# Patient Record
Sex: Female | Born: 1935 | Race: Black or African American | Hispanic: No | Smoking: Never smoker
Health system: Southern US, Community
[De-identification: ages and names within clinical notes are randomized; demographics above are authoritative.]

## PROBLEM LIST (undated history)

## (undated) DIAGNOSIS — I10 Essential (primary) hypertension: Secondary | ICD-10-CM

## (undated) HISTORY — PX: BACK SURGERY: SHX140

---

## 1988-10-20 HISTORY — PX: CERVICAL SPINE SURGERY: SHX589

## 2018-03-20 ENCOUNTER — Emergency Department (HOSPITAL_COMMUNITY): Payer: Medicare Other

## 2018-03-20 ENCOUNTER — Encounter (HOSPITAL_COMMUNITY): Payer: Self-pay | Admitting: Emergency Medicine

## 2018-03-20 ENCOUNTER — Inpatient Hospital Stay (HOSPITAL_COMMUNITY)
Admission: EM | Admit: 2018-03-20 | Discharge: 2018-03-25 | DRG: 481 | Disposition: A | Discharge status: Skilled Nursing Facility | Payer: Medicare Other | Attending: Family Medicine | Admitting: Family Medicine

## 2018-03-20 DIAGNOSIS — I69351 Hemiplegia and hemiparesis following cerebral infarction affecting right dominant side: Secondary | ICD-10-CM | POA: Diagnosis not present

## 2018-03-20 DIAGNOSIS — D62 Acute posthemorrhagic anemia: Secondary | ICD-10-CM | POA: Diagnosis not present

## 2018-03-20 DIAGNOSIS — E785 Hyperlipidemia, unspecified: Secondary | ICD-10-CM | POA: Diagnosis present

## 2018-03-20 DIAGNOSIS — E1142 Type 2 diabetes mellitus with diabetic polyneuropathy: Secondary | ICD-10-CM | POA: Diagnosis present

## 2018-03-20 DIAGNOSIS — Z7984 Long term (current) use of oral hypoglycemic drugs: Secondary | ICD-10-CM

## 2018-03-20 DIAGNOSIS — Z7902 Long term (current) use of antithrombotics/antiplatelets: Secondary | ICD-10-CM | POA: Diagnosis not present

## 2018-03-20 DIAGNOSIS — M549 Dorsalgia, unspecified: Secondary | ICD-10-CM | POA: Diagnosis present

## 2018-03-20 DIAGNOSIS — W010XXA Fall on same level from slipping, tripping and stumbling without subsequent striking against object, initial encounter: Secondary | ICD-10-CM | POA: Diagnosis present

## 2018-03-20 DIAGNOSIS — R52 Pain, unspecified: Secondary | ICD-10-CM

## 2018-03-20 DIAGNOSIS — Z01818 Encounter for other preprocedural examination: Secondary | ICD-10-CM

## 2018-03-20 DIAGNOSIS — I10 Essential (primary) hypertension: Secondary | ICD-10-CM | POA: Diagnosis present

## 2018-03-20 DIAGNOSIS — D649 Anemia, unspecified: Secondary | ICD-10-CM

## 2018-03-20 DIAGNOSIS — Y9289 Other specified places as the place of occurrence of the external cause: Secondary | ICD-10-CM | POA: Diagnosis not present

## 2018-03-20 DIAGNOSIS — G8929 Other chronic pain: Secondary | ICD-10-CM | POA: Diagnosis present

## 2018-03-20 DIAGNOSIS — Z8042 Family history of malignant neoplasm of prostate: Secondary | ICD-10-CM | POA: Diagnosis not present

## 2018-03-20 DIAGNOSIS — Z09 Encounter for follow-up examination after completed treatment for conditions other than malignant neoplasm: Secondary | ICD-10-CM | POA: Diagnosis not present

## 2018-03-20 DIAGNOSIS — S72002A Fracture of unspecified part of neck of left femur, initial encounter for closed fracture: Secondary | ICD-10-CM | POA: Diagnosis present

## 2018-03-20 DIAGNOSIS — Z7983 Long term (current) use of bisphosphonates: Secondary | ICD-10-CM

## 2018-03-20 DIAGNOSIS — Z880 Allergy status to penicillin: Secondary | ICD-10-CM | POA: Diagnosis not present

## 2018-03-20 DIAGNOSIS — Z833 Family history of diabetes mellitus: Secondary | ICD-10-CM

## 2018-03-20 DIAGNOSIS — Z66 Do not resuscitate: Secondary | ICD-10-CM | POA: Diagnosis present

## 2018-03-20 DIAGNOSIS — Z823 Family history of stroke: Secondary | ICD-10-CM

## 2018-03-20 DIAGNOSIS — S72009A Fracture of unspecified part of neck of unspecified femur, initial encounter for closed fracture: Secondary | ICD-10-CM | POA: Diagnosis present

## 2018-03-20 DIAGNOSIS — S72145A Nondisplaced intertrochanteric fracture of left femur, initial encounter for closed fracture: Principal | ICD-10-CM

## 2018-03-20 DIAGNOSIS — G8191 Hemiplegia, unspecified affecting right dominant side: Secondary | ICD-10-CM

## 2018-03-20 DIAGNOSIS — I6930 Unspecified sequelae of cerebral infarction: Secondary | ICD-10-CM | POA: Diagnosis not present

## 2018-03-20 DIAGNOSIS — S81802D Unspecified open wound, left lower leg, subsequent encounter: Secondary | ICD-10-CM | POA: Diagnosis not present

## 2018-03-20 DIAGNOSIS — M8580 Other specified disorders of bone density and structure, unspecified site: Secondary | ICD-10-CM | POA: Diagnosis present

## 2018-03-20 DIAGNOSIS — S81802A Unspecified open wound, left lower leg, initial encounter: Secondary | ICD-10-CM

## 2018-03-20 HISTORY — DX: Essential (primary) hypertension: I10

## 2018-03-20 LAB — BASIC METABOLIC PANEL
ANION GAP: 9 (ref 5–15)
BUN: 41 mg/dL — AB (ref 6–20)
CO2: 27 mmol/L (ref 22–32)
Calcium: 9.2 mg/dL (ref 8.9–10.3)
Chloride: 109 mmol/L (ref 101–111)
Creatinine, Ser: 1.11 mg/dL — ABNORMAL HIGH (ref 0.44–1.00)
GFR calc Af Amer: 51 mL/min — ABNORMAL LOW (ref >60)
GFR, EST NON AFRICAN AMERICAN: 44 mL/min — AB (ref >60)
GLUCOSE: 141 mg/dL — AB (ref 65–99)
POTASSIUM: 3.8 mmol/L (ref 3.5–5.1)
Sodium: 145 mmol/L (ref 135–145)

## 2018-03-20 LAB — CBC
HEMATOCRIT: 37.6 % (ref 36.0–46.0)
HEMOGLOBIN: 11.7 g/dL — AB (ref 12.0–15.0)
MCH: 26.8 pg (ref 26.0–34.0)
MCHC: 31.1 g/dL (ref 30.0–36.0)
MCV: 86.2 fL (ref 78.0–100.0)
Platelets: 191 10*3/uL (ref 150–400)
RBC: 4.36 MIL/uL (ref 3.87–5.11)
RDW: 14.5 % (ref 11.5–15.5)
WBC: 9 10*3/uL (ref 4.0–10.5)

## 2018-03-20 MED ORDER — MORPHINE SULFATE (PF) 4 MG/ML IV SOLN
4.0000 mg | Freq: Once | INTRAVENOUS | Status: AC
  Administered 2018-03-20: 4 mg via INTRAVENOUS
  Filled 2018-03-20: qty 1

## 2018-03-20 MED ORDER — MORPHINE SULFATE (PF) 4 MG/ML IV SOLN
4.0000 mg | Freq: Once | INTRAVENOUS | Status: DC
  Filled 2018-03-20: qty 1

## 2018-03-20 MED ORDER — MORPHINE SULFATE (PF) 2 MG/ML IV SOLN
1.0000 mg | INTRAVENOUS | Status: DC | PRN
  Administered 2018-03-20: 1 mg via INTRAVENOUS

## 2018-03-20 NOTE — Progress Notes (Signed)
Patient ID: Linda PalmsDarsena Hartman, female   DOB: 06/24/1931, 84 y.o.   MRN: [id number] I have seen the x-rays on [name].  She has a non-displaced left hip intertrochanteric fracture.  Will plan for surgery tomorrow (6/2).  Will need to be NPO after midnight tonight.  This type of fracture pattern will do well with a rod/hip screw and should allow the patient to be able to fully bear weight post-op.

## 2018-03-20 NOTE — ED Notes (Signed)
Bed: [id number] Expected date:  Expected time:  Means of arrival:  Comments: 86 f fall

## 2018-03-20 NOTE — ED Triage Notes (Signed)
[place], had a fall about a hour ago, uses a walker and caught in a revolving door. C/o left hip pain. Pain score zero / 20 in Lhand/ 50 mcg fentyle in. No pain now. V/s on bp 136/56, pluse 72, rr20 spo2 94, cbg 177. Hx of diabetes. Hx of HTN.Alert x 4, no loc no blood thinners,

## 2018-03-20 NOTE — ED Provider Notes (Addendum)
[facility]-EMERGENCY DEPT Provider Note   CSN: [id number] Arrival date & time: 03/20/18  1938     History   Chief Complaint Chief Complaint  Patient presents with  . Hip Pain    left     HPI [name] is a 84 y.o. female.  Patient attending convention, states got knocked off balance/to ground by revolving door. C/o left hip pain. Has not tried to ambulate since. No prior hip injury. Pain constant, dull, moderate, worse w movement, non radiating. Denies numbness/weakness. Denies any other pain or injury. No headache. No neck or back pain. No chest pain or sob. No anticoag use.   The history is provided by the patient.  Hip Pain  Pertinent negatives include no chest pain, no abdominal pain, no headaches and no shortness of breath.    History reviewed. No pertinent past medical history.  There are no active problems to display for this patient.   History reviewed. No pertinent surgical history.   OB History   None      Home Medications    Prior to Admission medications   Not on File    Family History No family history on file.  Social History Social History   Tobacco Use  . Smoking status: Not on file  Substance Use Topics  . Alcohol use: Not on file  . Drug use: Not on file     Allergies   Patient has no allergy information on record.   Review of Systems Review of Systems  Constitutional: Negative for fever.  HENT: Negative for sore throat.   Eyes: Negative for redness.  Respiratory: Negative for shortness of breath.   Cardiovascular: Negative for chest pain.  Gastrointestinal: Negative for abdominal pain and vomiting.  Genitourinary: Negative for flank pain.  Musculoskeletal: Negative for back pain and neck pain.  Skin: Negative for wound.  Neurological: Negative for headaches.  Hematological: Does not bruise/bleed easily.  Psychiatric/Behavioral: Negative for confusion.     Physical Exam Updated Vital  Signs BP (!) 165/60 (BP Location: Right Arm)   Pulse 60   Temp 98.1 F (36.7 C) (Oral)   Resp 20   SpO2 99%   Physical Exam  Constitutional: She appears well-developed and well-nourished.  HENT:  Head: Atraumatic.  Eyes: Pupils are equal, round, and reactive to light. Conjunctivae are normal. No scleral icterus.  Neck: Neck supple. No tracheal deviation present.  Cardiovascular: Normal rate, regular rhythm, normal heart sounds and intact distal pulses.  Pulmonary/Chest: Effort normal and breath sounds normal. No respiratory distress. She exhibits no tenderness.  Abdominal: Soft. Normal appearance and bowel sounds are normal. She exhibits no distension. There is no tenderness.  Musculoskeletal: She exhibits no edema.  CTLS spine, non tender, aligned, no step off. Tenderness left hip. No gross deformity, shortening or rotation of leg. Distal pulses palp bil.   Neurological: She is alert.  Speech normal. Motor/sens grossly intact bil.   Skin: Skin is warm and dry. No rash noted.  Psychiatric: She has a normal mood and affect.  Nursing note and vitals reviewed.    ED Treatments / Results  Labs (all labs ordered are listed, but only abnormal results are displayed) No results found for this or any previous visit. Dg Chest 1 View  Result Date: 03/20/2018 CLINICAL DATA:  Preoperative chest radiograph prior to LEFT hip surgery. EXAM: CHEST  1 VIEW COMPARISON:  None. FINDINGS: The patient is slightly rotated. UPPER limits normal heart size noted. There is no  evidence of focal airspace disease, pulmonary edema, suspicious pulmonary nodule/mass, pleural effusion, or pneumothorax. No acute bony abnormalities are identified. IMPRESSION: No active disease. Electronically Signed   By: Harmon Pier M.D.   On: 03/20/2018 22:41   Dg Hip Unilat W Or W/o Pelvis 2-3 Views Left  Result Date: 03/20/2018 CLINICAL DATA:  Recent fall left hip pain, initial encounter EXAM: DG HIP (WITH OR WITHOUT PELVIS) 2-3V  LEFT COMPARISON:  None. FINDINGS: There is a proximal left femoral fracture involving the intratrochanteric region without significant displacement. Pelvic ring is intact. No other focal abnormality is seen. IMPRESSION: Undisplaced intratrochanteric left femoral fracture. Electronically Signed   By: Alcide Clever M.D.   On: 03/20/2018 21:44    EKG EKG Interpretation  Date/Time:  Saturday March 20 2018 22:54:43 EDT Ventricular Rate:  58 PR Interval:    QRS Duration: 79 QT Interval:  435 QTC Calculation: 428 R Axis:   10 Text Interpretation:  Sinus rhythm Ventricular premature complex Nonspecific T wave abnormality Confirmed by Cathren Laine ([id number]) on 03/20/2018 10:57:35 PM   Radiology Dg Hip Unilat W Or W/o Pelvis 2-3 Views Left  Result Date: 03/20/2018 CLINICAL DATA:  Recent fall left hip pain, initial encounter EXAM: DG HIP (WITH OR WITHOUT PELVIS) 2-3V LEFT COMPARISON:  None. FINDINGS: There is a proximal left femoral fracture involving the intratrochanteric region without significant displacement. Pelvic ring is intact. No other focal abnormality is seen. IMPRESSION: Undisplaced intratrochanteric left femoral fracture. Electronically Signed   By: Alcide Clever M.D.   On: 03/20/2018 21:44    Procedures Procedures (including critical care time)  Medications Ordered in ED Medications  morphine 4 MG/ML injection 4 mg (0 mg Intravenous Hold 03/20/18 2217)  morphine 4 MG/ML injection 4 mg (4 mg Intravenous Given 03/20/18 2216)     Initial Impression / Assessment and Plan / ED Course  I have reviewed the triage vital signs and the nursing notes.  Pertinent labs & imaging results that were available during my care of the patient were reviewed by me and considered in my medical decision making (see chart for details).  Xrays.  Imaging study reviewed - left intertroch fx, non displaced.   Orthopedics on call consulted - discussed with Dr Magnus Ivan - he will see in AM, states tentatively will  plan for surgery tomorrow - states estimate for noon tomorrow.   Morphine 4 mg iv for pain.  Pain improved.   Hospitalists consulted for admission - discussed w Dr Adela Glimpse - will admit.   Final Clinical Impressions(s) / ED Diagnoses   Final diagnoses:  Fall from slip, trip, or stumble, initial encounter  Closed nondisplaced intertrochanteric fracture of left femur, initial encounter [facility])    ED Discharge Orders    None         Cathren Laine, MD 03/20/18 2258

## 2018-03-20 NOTE — H&P (Signed)
[name]:[id number] DOB: October 31, 1930 DOA: 03/20/2018     VHQ:[id number] Linda Skeens, MD  [facility]   Patient arrived to ER on 03/20/18 at 1938  Patient coming from:   home      Chief Complaint:  Chief Complaint  Patient presents with  . Hip Pain    left     HPI: [name] is a 84 y.o. female with medical history significant of DM2, polyneuropathy,  HTN, HLD, chronic back pain, hx of CVA with residual right side weakness.     Presented with  Fall after got walker stuck in revolving door.  Head injury denies loss of consciousness no other injury.  No chest pain shortness of breath patient is on Plavix for history of CVa no hx of stents.  Patient is not local she leaves in [place] Able to walk a flight of stairs without shortness of breath. No Chest pain, no shortness of breath.  Reports no hx of CAD,  Had CVA 2011 with residual  right side weakness   While in ER: Found to have non-displaced left hip intertrochanteric fracture   Following Medications were ordered in ER: Medications  morphine 4 MG/ML injection 4 mg (0 mg Intravenous Hold 03/20/18 2217)  morphine 4 MG/ML injection 4 mg (4 mg Intravenous Given 03/20/18 2216)    Significant initial  Findings: Abnormal Labs Reviewed  CBC - Abnormal; Notable for the following components:      Result Value   Hemoglobin 11.7 (*)    All other components within normal limits  BASIC METABOLIC PANEL - Abnormal; Notable for the following components:   Glucose, Bld 141 (*)    BUN 41 (*)    Creatinine, Ser 1.11 (*)    GFR calc non Af Amer 44 (*)    GFR calc Af Amer 51 (*)    All other components within normal limits     Na 145 K 3.8  Cr   Lab Results  Component Value Date   CREATININE 1.11 (H) 03/20/2018      WBC  9.0  HG/HCT      Component Value Date/Time   HGB 11.7 (L) 03/20/2018 2256   HCT 37.6 03/20/2018 2256      UA  not ordered     CXR - ordered  Hip left  -Undisplaced intratrochanteric left femoral fracture.  ECG:  Personally reviewed by me showing: HR : 58 Rhythm:  NSR,    no evidence of ischemic changes QTC 428     ED Triage Vitals  Enc Vitals Group     BP 03/20/18 1955 (!) 165/60     Pulse Rate 03/20/18 1955 60     Resp 03/20/18 1955 20     Temp 03/20/18 1955 98.1 F (36.7 C)     Temp Source 03/20/18 1955 Oral     SpO2 03/20/18 1955 99 %     Weight --      Height --      Head Circumference --      Peak Flow --      Pain Score 03/20/18 1957 0     Pain Loc --      Pain Edu? --      Excl. in GC? --   TMAX(24)@       Latest  Blood pressure (!) 165/60, pulse 60, temperature 98.1 F (36.7 C), temperature source Oral, resp. rate 20, SpO2 99 %.    ER Provider Called:   Orthopedics  Dr  Magnus Ivan They Recommend NPO post midnight,  Will see in AM  Hospitalist was called for admission for left hip fracture   Review of Systems:    Pertinent positives include: fall  Constitutional:  No weight loss, night sweats, Fevers, chills, fatigue, weight loss  HEENT:  No headaches, Difficulty swallowing,Tooth/dental problems,Sore throat,  No sneezing, itching, ear ache, nasal congestion, post nasal drip,  Cardio-vascular:  No chest pain, Orthopnea, PND, anasarca, dizziness, palpitations.no Bilateral lower extremity swelling  GI:  No heartburn, indigestion, abdominal pain, nausea, vomiting, diarrhea, change in bowel habits, loss of appetite, melena, blood in stool, hematemesis Resp:  no shortness of breath at rest. No dyspnea on exertion, No excess mucus, no productive cough, No non-productive cough, No coughing up of blood.No change in color of mucus.No wheezing. Skin:  no rash or lesions. No jaundice GU:  no dysuria, change in color of urine, no urgency or frequency. No straining to urinate.  No flank pain.  Musculoskeletal:  No joint pain or no joint swelling. No decreased range of motion. No back pain.  Psych:  No change  in mood or affect. No depression or anxiety. No memory loss.  Neuro: no localizing neurological complaints, no tingling, no weakness, no double vision, no gait abnormality, no slurred speech, no confusion  As per HPI otherwise 10 point review of systems negative.   Past Medical History:   Past Medical History:  Diagnosis Date  . Hypertension       Past Surgical History:  Procedure Laterality Date  . BACK SURGERY    . CERVICAL SPINE SURGERY  1990    Social History:  Ambulatory walker       reports that she has never smoked. She has never used smokeless tobacco. She reports that she does not drink alcohol. Her drug history is not on file.     Family History:   Family History  Problem Relation Age of Onset  . Diabetes Mother   . Stroke Mother   . Prostate cancer Brother     Allergies: Not on File   Prior to Admission medications   Not on File   Physical Exam: Blood pressure (!) 165/60, pulse 60, temperature 98.1 F (36.7 C), temperature source Oral, resp. rate 20, SpO2 99 %. 1. General:  in No Acute distress Well -appearing 2. Psychological: Alert and   Oriented 3. Head/ENT:     Dry Mucous Membranes                          Head Non traumatic, neck supple                           Poor Dentition 4. SKIN:   decreased Skin turgor,  Skin clean Dry and intact no rash 5. Heart: Regular rate and rhythm no  Murmur, no Rub or gallop 6. Lungs:  no wheezes or crackles   7. Abdomen: Soft, non-tender, Non distended  obese  bowel sounds present 8. Lower extremities: no clubbing, cyanosis, or edema 9. Neurologically Grossly intact, moving all 4 extremities equally  10. MSK: Normal range of motion except left hip due to pain    LABS:     Recent Labs  Lab 03/20/18 2256  WBC 9.0  HGB 11.7*  HCT 37.6  MCV 86.2  PLT 191   Basic Metabolic Panel: Recent Labs  Lab 03/20/18 2256  NA 145  K 3.8  CL 109  CO2 27  GLUCOSE 141*  BUN 41*  CREATININE 1.11*  CALCIUM  9.2      Radiological Exams on Admission: Dg Chest 1 View  Result Date: 03/20/2018 CLINICAL DATA:  Preoperative chest radiograph prior to LEFT hip surgery. EXAM: CHEST  1 VIEW COMPARISON:  None. FINDINGS: The patient is slightly rotated. UPPER limits normal heart size noted. There is no evidence of focal airspace disease, pulmonary edema, suspicious pulmonary nodule/mass, pleural effusion, or pneumothorax. No acute bony abnormalities are identified. IMPRESSION: No active disease. Electronically Signed   By: Harmon PierJeffrey  Hu M.D.   On: 03/20/2018 22:41   Dg Hip Unilat W Or W/o Pelvis 2-3 Views Left  Result Date: 03/20/2018 CLINICAL DATA:  Recent fall left hip pain, initial encounter EXAM: DG HIP (WITH OR WITHOUT PELVIS) 2-3V LEFT COMPARISON:  None. FINDINGS: There is a proximal left femoral fracture involving the intratrochanteric region without significant displacement. Pelvic ring is intact. No other focal abnormality is seen. IMPRESSION: Undisplaced intratrochanteric left femoral fracture. Electronically Signed   By: Alcide CleverMark  Lukens M.D.   On: 03/20/2018 21:44    Chart has been reviewed    Assessment/Plan   84 y.o. female with medical history significant of DM2, HTN    Admitted for left intertrochanteric hip fracture  Present on Admission:  . Closed left hip fracture (HCC) -  - management as per orthopedics,  plan to operate at noon.    Keep nothing by mouth post midnight. Patient  on   antiplatelet agents (plavix aspirin )on hold Ordered type and screen, Place Foley, order a vitamin D level  Patient at baseline  able to walk a flight of stairs or 100 feet      Patient denies any chest pain or shortness of breath currently and/or with exertion,    ECG showing no evidence of acute ischemia  no known history of coronary artery disease,  COPD  Liver failure   CKD  Given advanced age patient is at least moderate  risk   which has been discussed with family but at this point no furthther  cardiac workup is indicated.    . Essential hypertension stable resume home medications when able to take p.o.  History of CVA Plavix on hold   DM 2 -  - Order Sensitive   SSI     -  check TSH and HgA1C  - Hold by mouth medications     Other plan as per orders.  DVT prophylaxis:  SCD   Code Status:   DNR/DNI   as per patient   I had personally discussed CODE STATUS with patient and family   Family Communication:   Family   at  Bedside  plan of care was discussed with  Son,  Sister,    Disposition Plan:   likely will need placement for rehabilitation                                         Social Work    consulted                          Consults called: Dr. Magnus IvanBlackman of orthopedics  Admission status:    inpatient      Level of care    medical floor  Tatijana Bierly 03/21/2018, 12:18 AM    [facility]  Pager [phone]   after 2 AM please page floor coverage PA If 7AM-7PM, please contact the day team taking care of the patient  [email or web address]  Password [email or web address]

## 2018-03-21 ENCOUNTER — Other Ambulatory Visit: Payer: Self-pay

## 2018-03-21 ENCOUNTER — Inpatient Hospital Stay (HOSPITAL_COMMUNITY): Payer: Medicare Other | Admitting: Certified Registered Nurse Anesthetist

## 2018-03-21 ENCOUNTER — Inpatient Hospital Stay (HOSPITAL_COMMUNITY): Payer: Medicare Other

## 2018-03-21 ENCOUNTER — Encounter (HOSPITAL_COMMUNITY): Payer: Self-pay

## 2018-03-21 ENCOUNTER — Encounter (HOSPITAL_COMMUNITY)
Admission: EM | Disposition: A | Discharge status: Skilled Nursing Facility | Payer: Self-pay | Source: Home / Self Care | Attending: Internal Medicine

## 2018-03-21 DIAGNOSIS — G8191 Hemiplegia, unspecified affecting right dominant side: Secondary | ICD-10-CM

## 2018-03-21 DIAGNOSIS — S72145A Nondisplaced intertrochanteric fracture of left femur, initial encounter for closed fracture: Secondary | ICD-10-CM

## 2018-03-21 HISTORY — PX: FEMUR IM NAIL: SHX1597

## 2018-03-21 LAB — COMPREHENSIVE METABOLIC PANEL
ALBUMIN: 3.5 g/dL (ref 3.5–5.0)
ALT: 18 U/L (ref 14–54)
AST: 17 U/L (ref 15–41)
Alkaline Phosphatase: 55 U/L (ref 38–126)
Anion gap: 11 (ref 5–15)
BUN: 37 mg/dL — AB (ref 6–20)
CALCIUM: 8.9 mg/dL (ref 8.9–10.3)
CHLORIDE: 107 mmol/L (ref 101–111)
CO2: 26 mmol/L (ref 22–32)
Creatinine, Ser: 0.88 mg/dL (ref 0.44–1.00)
GFR calc Af Amer: >60 mL/min (ref >60)
GFR, EST NON AFRICAN AMERICAN: 59 mL/min — AB (ref >60)
GLUCOSE: 187 mg/dL — AB (ref 65–99)
Potassium: 3.5 mmol/L (ref 3.5–5.1)
Sodium: 144 mmol/L (ref 135–145)
TOTAL PROTEIN: 6.1 g/dL — AB (ref 6.5–8.1)
Total Bilirubin: 0.7 mg/dL (ref 0.3–1.2)

## 2018-03-21 LAB — GLUCOSE, CAPILLARY
GLUCOSE-CAPILLARY: 171 mg/dL — AB (ref 65–99)
Glucose-Capillary: 118 mg/dL — ABNORMAL HIGH (ref 65–99)
Glucose-Capillary: 308 mg/dL — ABNORMAL HIGH (ref 65–99)
Glucose-Capillary: 288 mg/dL — ABNORMAL HIGH (ref 65–99)

## 2018-03-21 LAB — CBC
HCT: 36.3 % (ref 36.0–46.0)
HEMOGLOBIN: 11.4 g/dL — AB (ref 12.0–15.0)
MCH: 26.8 pg (ref 26.0–34.0)
MCHC: 31.4 g/dL (ref 30.0–36.0)
MCV: 85.2 fL (ref 78.0–100.0)
Platelets: 168 10*3/uL (ref 150–400)
RBC: 4.26 MIL/uL (ref 3.87–5.11)
RDW: 14.5 % (ref 11.5–15.5)
WBC: 8.2 10*3/uL (ref 4.0–10.5)

## 2018-03-21 LAB — TYPE AND SCREEN
ABO/RH(D): B POS
Antibody Screen: NEGATIVE

## 2018-03-21 LAB — PROTIME-INR
INR: 1
Prothrombin Time: 13.1 seconds (ref 11.4–15.2)

## 2018-03-21 LAB — ABO/RH: ABO/RH(D): B POS

## 2018-03-21 LAB — HEMOGLOBIN A1C
HEMOGLOBIN A1C: 7 % — AB (ref 4.8–5.6)
Mean Plasma Glucose: 154.2 mg/dL

## 2018-03-21 SURGERY — INSERTION, INTRAMEDULLARY ROD, FEMUR
Anesthesia: General | Site: Hip | Laterality: Left

## 2018-03-21 MED ORDER — SUGAMMADEX SODIUM 200 MG/2ML IV SOLN
INTRAVENOUS | Status: DC | PRN
  Administered 2018-03-21: 200 mg via INTRAVENOUS

## 2018-03-21 MED ORDER — HYDROCODONE-ACETAMINOPHEN 5-325 MG PO TABS
1.0000 | ORAL_TABLET | Freq: Four times a day (QID) | ORAL | Status: DC | PRN
  Filled 2018-03-21 (×4): qty 2

## 2018-03-21 MED ORDER — GABAPENTIN 300 MG PO CAPS
300.0000 mg | ORAL_CAPSULE | Freq: Three times a day (TID) | ORAL | Status: DC
  Administered 2018-03-21 – 2018-03-25 (×11): 300 mg via ORAL
  Filled 2018-03-21 (×11): qty 1

## 2018-03-21 MED ORDER — SODIUM CHLORIDE 0.9 % IV SOLN
INTRAVENOUS | Status: AC
  Administered 2018-03-21: 01:00:00 via INTRAVENOUS

## 2018-03-21 MED ORDER — DOCUSATE SODIUM 100 MG PO CAPS: 100.0000 mg | ORAL_CAPSULE | Freq: Two times a day (BID) | ORAL | Status: DC

## 2018-03-21 MED ORDER — OXYCODONE HCL 5 MG/5ML PO SOLN
5.0000 mg | Freq: Once | ORAL | Status: DC | PRN
  Filled 2018-03-21: qty 5

## 2018-03-21 MED ORDER — ONDANSETRON HCL 4 MG/2ML IJ SOLN
INTRAMUSCULAR | Status: DC | PRN
  Administered 2018-03-21: 4 mg via INTRAVENOUS

## 2018-03-21 MED ORDER — ROCURONIUM BROMIDE 10 MG/ML (PF) SYRINGE
PREFILLED_SYRINGE | INTRAVENOUS | Status: DC | PRN
  Administered 2018-03-21: 50 mg via INTRAVENOUS

## 2018-03-21 MED ORDER — ONDANSETRON HCL 4 MG PO TABS: 4.0000 mg | ORAL_TABLET | Freq: Four times a day (QID) | ORAL | Status: DC | PRN

## 2018-03-21 MED ORDER — 0.9 % SODIUM CHLORIDE (POUR BTL) OPTIME
TOPICAL | Status: DC | PRN
  Administered 2018-03-21: 1000 mL

## 2018-03-21 MED ORDER — MORPHINE SULFATE (PF) 2 MG/ML IV SOLN: 0.5000 mg | INTRAVENOUS | Status: DC | PRN

## 2018-03-21 MED ORDER — MENTHOL 3 MG MT LOZG: 1.0000 | LOZENGE | OROMUCOSAL | Status: DC | PRN

## 2018-03-21 MED ORDER — ACETAMINOPHEN 325 MG PO TABS: 325.0000 mg | ORAL_TABLET | Freq: Four times a day (QID) | ORAL | Status: DC | PRN

## 2018-03-21 MED ORDER — HYDROCODONE-ACETAMINOPHEN 7.5-325 MG PO TABS: 1.0000 | ORAL_TABLET | ORAL | Status: DC | PRN

## 2018-03-21 MED ORDER — ONDANSETRON HCL 4 MG/2ML IJ SOLN: 4.0000 mg | Freq: Four times a day (QID) | INTRAMUSCULAR | Status: DC | PRN

## 2018-03-21 MED ORDER — OXYCODONE HCL 5 MG PO TABS: 5.0000 mg | ORAL_TABLET | Freq: Once | ORAL | Status: DC | PRN

## 2018-03-21 MED ORDER — SENNA 8.6 MG PO TABS
1.0000 | ORAL_TABLET | Freq: Two times a day (BID) | ORAL | Status: DC
  Administered 2018-03-21 – 2018-03-25 (×7): 8.6 mg via ORAL
  Filled 2018-03-21 (×7): qty 1

## 2018-03-21 MED ORDER — POLYETHYLENE GLYCOL 3350 17 G PO PACK: 17.0000 g | PACK | Freq: Every day | ORAL | Status: DC | PRN

## 2018-03-21 MED ORDER — DEXAMETHASONE SODIUM PHOSPHATE 10 MG/ML IJ SOLN
INTRAMUSCULAR | Status: DC | PRN
  Administered 2018-03-21: 5 mg via INTRAVENOUS

## 2018-03-21 MED ORDER — SUCCINYLCHOLINE CHLORIDE 20 MG/ML IJ SOLN
INTRAMUSCULAR | Status: DC | PRN
  Administered 2018-03-21: 100 mg via INTRAVENOUS

## 2018-03-21 MED ORDER — METOCLOPRAMIDE HCL 5 MG/ML IJ SOLN: 5.0000 mg | Freq: Three times a day (TID) | INTRAMUSCULAR | Status: DC | PRN

## 2018-03-21 MED ORDER — CLOPIDOGREL BISULFATE 75 MG PO TABS
75.0000 mg | ORAL_TABLET | Freq: Every day | ORAL | Status: DC
  Administered 2018-03-22 – 2018-03-25 (×4): 75 mg via ORAL
  Filled 2018-03-21 (×4): qty 1

## 2018-03-21 MED ORDER — METOCLOPRAMIDE HCL 5 MG PO TABS: 5.0000 mg | ORAL_TABLET | Freq: Three times a day (TID) | ORAL | Status: DC | PRN

## 2018-03-21 MED ORDER — PHENOL 1.4 % MT LIQD
1.0000 | OROMUCOSAL | Status: DC | PRN
  Filled 2018-03-21: qty 177

## 2018-03-21 MED ORDER — MORPHINE SULFATE (PF) 2 MG/ML IV SOLN: 2.0000 mg | INTRAVENOUS | Status: DC | PRN

## 2018-03-21 MED ORDER — LACTATED RINGERS IV SOLN
INTRAVENOUS | Status: DC
  Administered 2018-03-21 (×2): via INTRAVENOUS

## 2018-03-21 MED ORDER — DOCUSATE SODIUM 100 MG PO CAPS
100.0000 mg | ORAL_CAPSULE | Freq: Two times a day (BID) | ORAL | Status: DC
  Administered 2018-03-21 – 2018-03-25 (×8): 100 mg via ORAL
  Filled 2018-03-21 (×8): qty 1

## 2018-03-21 MED ORDER — HYDROCODONE-ACETAMINOPHEN 5-325 MG PO TABS
1.0000 | ORAL_TABLET | ORAL | Status: DC | PRN
  Administered 2018-03-21 (×3): 2 via ORAL
  Administered 2018-03-22: 1 via ORAL
  Administered 2018-03-22: 2 via ORAL
  Administered 2018-03-22 (×2): 1 via ORAL
  Administered 2018-03-22: 2 via ORAL
  Administered 2018-03-23 – 2018-03-25 (×8): 1 via ORAL
  Filled 2018-03-21 (×8): qty 1
  Filled 2018-03-21: qty 2
  Filled 2018-03-21 (×3): qty 1

## 2018-03-21 MED ORDER — AMLODIPINE BESYLATE 10 MG PO TABS
10.0000 mg | ORAL_TABLET | Freq: Every day | ORAL | Status: DC
  Administered 2018-03-22 – 2018-03-25 (×3): 10 mg via ORAL
  Filled 2018-03-21 (×3): qty 1

## 2018-03-21 MED ORDER — PROPOFOL 10 MG/ML IV BOLUS
INTRAVENOUS | Status: DC | PRN
  Administered 2018-03-21: 140 mg via INTRAVENOUS

## 2018-03-21 MED ORDER — METHOCARBAMOL 500 MG PO TABS
500.0000 mg | ORAL_TABLET | Freq: Four times a day (QID) | ORAL | Status: DC | PRN
Start: 2018-03-21 — End: 2018-03-25
  Administered 2018-03-21 – 2018-03-22 (×3): 500 mg via ORAL
  Filled 2018-03-21 (×4): qty 1

## 2018-03-21 MED ORDER — FENTANYL CITRATE (PF) 100 MCG/2ML IJ SOLN
INTRAMUSCULAR | Status: DC | PRN
  Administered 2018-03-21: 100 ug via INTRAVENOUS
  Administered 2018-03-21 (×2): 50 ug via INTRAVENOUS

## 2018-03-21 MED ORDER — METHOCARBAMOL 1000 MG/10ML IJ SOLN
500.0000 mg | Freq: Four times a day (QID) | INTRAVENOUS | Status: DC | PRN
  Filled 2018-03-21: qty 5

## 2018-03-21 MED ORDER — LIDOCAINE 2% (20 MG/ML) 5 ML SYRINGE
INTRAMUSCULAR | Status: DC | PRN
  Administered 2018-03-21: 60 mg via INTRAVENOUS

## 2018-03-21 MED ORDER — INSULIN ASPART 100 UNIT/ML ~~LOC~~ SOLN
0.0000 [IU] | SUBCUTANEOUS | Status: DC
  Administered 2018-03-21: 2 [IU] via SUBCUTANEOUS
  Administered 2018-03-21: 7 [IU] via SUBCUTANEOUS
  Administered 2018-03-21: 1 [IU] via SUBCUTANEOUS
  Administered 2018-03-21: 5 [IU] via SUBCUTANEOUS
  Administered 2018-03-21: 2 [IU] via SUBCUTANEOUS
  Administered 2018-03-22 (×2): 1 [IU] via SUBCUTANEOUS

## 2018-03-21 MED ORDER — FENTANYL CITRATE (PF) 250 MCG/5ML IJ SOLN
INTRAMUSCULAR | Status: AC
  Filled 2018-03-21: qty 5

## 2018-03-21 MED ORDER — CLINDAMYCIN PHOSPHATE 900 MG/50ML IV SOLN
INTRAVENOUS | Status: DC | PRN
  Administered 2018-03-21: 900 mg via INTRAVENOUS

## 2018-03-21 MED ORDER — ATORVASTATIN CALCIUM 40 MG PO TABS
40.0000 mg | ORAL_TABLET | Freq: Every day | ORAL | Status: DC
  Administered 2018-03-21 – 2018-03-24 (×4): 40 mg via ORAL
  Filled 2018-03-21 (×4): qty 1

## 2018-03-21 MED ORDER — FENTANYL CITRATE (PF) 100 MCG/2ML IJ SOLN: 25.0000 ug | INTRAMUSCULAR | Status: DC | PRN

## 2018-03-21 MED ORDER — CLINDAMYCIN PHOSPHATE 900 MG/50ML IV SOLN
INTRAVENOUS | Status: AC
  Filled 2018-03-21: qty 50

## 2018-03-21 MED ORDER — CLINDAMYCIN PHOSPHATE 600 MG/50ML IV SOLN
600.0000 mg | Freq: Four times a day (QID) | INTRAVENOUS | Status: AC
  Administered 2018-03-21 (×2): 600 mg via INTRAVENOUS
  Filled 2018-03-21 (×3): qty 50

## 2018-03-21 SURGICAL SUPPLY — 39 items
BLADE SURG 15 STRL LF DISP TIS (BLADE) ×1 IMPLANT
BLADE SURG 15 STRL SS (BLADE) ×2
BNDG GAUZE ELAST 4 BULKY (GAUZE/BANDAGES/DRESSINGS) ×3 IMPLANT
COVER PERINEAL POST (MISCELLANEOUS) ×3 IMPLANT
DRAPE INCISE IOBAN 66X45 STRL (DRAPES) ×3 IMPLANT
DRSG AQUACEL AG ADV 3.5X 4 (GAUZE/BANDAGES/DRESSINGS) ×6 IMPLANT
DRSG MEPILEX BORDER 4X4 (GAUZE/BANDAGES/DRESSINGS) IMPLANT
DRSG MEPILEX BORDER 4X8 (GAUZE/BANDAGES/DRESSINGS) ×3 IMPLANT
DRSG PAD ABDOMINAL 8X10 ST (GAUZE/BANDAGES/DRESSINGS) ×6 IMPLANT
DURAPREP 26ML APPLICATOR (WOUND CARE) ×3 IMPLANT
ELECT REM PT RETURN 15FT ADLT (MISCELLANEOUS) ×3 IMPLANT
FACESHIELD WRAPAROUND (MASK) ×3 IMPLANT
GAUZE XEROFORM 5X9 LF (GAUZE/BANDAGES/DRESSINGS) ×3 IMPLANT
GLOVE BIOGEL PI IND STRL 8 (GLOVE) ×2 IMPLANT
GLOVE BIOGEL PI INDICATOR 8 (GLOVE) ×4
GLOVE ECLIPSE 8.0 STRL XLNG CF (GLOVE) ×3 IMPLANT
GLOVE ORTHO TXT STRL SZ7.5 (GLOVE) ×3 IMPLANT
GOWN STRL REUS W/ TWL XL LVL3 (GOWN DISPOSABLE) IMPLANT
GOWN STRL REUS W/TWL LRG LVL3 (GOWN DISPOSABLE) ×3 IMPLANT
GOWN STRL REUS W/TWL XL LVL3 (GOWN DISPOSABLE)
GUIDEPIN 3.2X17.5 THRD DISP (PIN) ×3 IMPLANT
HFN LH 130 DEG 11MM X 380MM (Orthopedic Implant) ×3 IMPLANT
HIP FRAC NAIL LAG SCR 10.5X100 (Orthopedic Implant) ×2 IMPLANT
KIT BASIN OR (CUSTOM PROCEDURE TRAY) ×3 IMPLANT
KIT ROOM TURNOVER OR (KITS) ×3 IMPLANT
MANIFOLD NEPTUNE II (INSTRUMENTS) ×3 IMPLANT
NS IRRIG 1000ML POUR BTL (IV SOLUTION) ×3 IMPLANT
PACK GENERAL/GYN (CUSTOM PROCEDURE TRAY) ×3 IMPLANT
PAD ARMBOARD 7.5X6 YLW CONV (MISCELLANEOUS) ×6 IMPLANT
PAD CAST 4YDX4 CTTN HI CHSV (CAST SUPPLIES) ×2 IMPLANT
PADDING CAST COTTON 4X4 STRL (CAST SUPPLIES) ×4
SCREW CANN THRD AFF 10.5X100 (Orthopedic Implant) ×1 IMPLANT
STAPLER VISISTAT 35W (STAPLE) ×3 IMPLANT
SUT VIC AB 0 CT1 27 (SUTURE) ×4
SUT VIC AB 0 CT1 27XBRD ANBCTR (SUTURE) ×2 IMPLANT
SUT VIC AB 2-0 CT1 27 (SUTURE) ×4
SUT VIC AB 2-0 CT1 TAPERPNT 27 (SUTURE) ×2 IMPLANT
TOWEL OR 17X24 6PK STRL BLUE (TOWEL DISPOSABLE) ×3 IMPLANT
TOWEL OR 17X26 10 PK STRL BLUE (TOWEL DISPOSABLE) ×3 IMPLANT

## 2018-03-21 NOTE — Transfer of Care (Signed)
Immediate Anesthesia Transfer of Care Note  Patient: [name]  Procedure(s) Performed: INTRAMEDULLARY (IM) NAIL INTERTROCHANTERIC (Left Hip)  Patient Location: PACU  Anesthesia Type:General  Level of Consciousness: awake, alert  and oriented  Airway & Oxygen Therapy: Patient Spontanous Breathing and Patient connected to face mask oxygen  Post-op Assessment: Report given to RN and Post -op Vital signs reviewed and stable  Post vital signs: Reviewed and stable  Last Vitals:  Vitals Value Taken Time  BP    Temp    Pulse 63 03/21/2018 12:28 PM  Resp 16 03/21/2018 12:28 PM  SpO2 100 % 03/21/2018 12:28 PM  Vitals shown include unvalidated device data.  Last Pain:  Vitals:   03/21/18 0909  TempSrc:   PainSc: 0-No pain         Complications: No apparent anesthesia complications

## 2018-03-21 NOTE — Anesthesia Procedure Notes (Signed)
Procedure Name: Intubation Performed by: Kizzie Fantasiaarver, Nevena Rozenberg J, CRNA Pre-anesthesia Checklist: Patient identified, Emergency Drugs available, Suction available, Patient being monitored and Timeout performed Patient Re-evaluated:Patient Re-evaluated prior to induction Oxygen Delivery Method: Circle system utilized Preoxygenation: Pre-oxygenation with 100% oxygen Induction Type: IV induction Ventilation: Mask ventilation without difficulty Laryngoscope Size: Glidescope and 4 Grade View: Grade II Tube type: Oral Tube size: 7.0 mm Number of attempts: 2 Airway Equipment and Method: Stylet and Video-laryngoscopy Placement Confirmation: ETT inserted through vocal cords under direct vision,  positive ETCO2,  CO2 detector and breath sounds checked- equal and bilateral Secured at: 22 cm Tube secured with: Tape Dental Injury: Teeth and Oropharynx as per pre-operative assessment  Difficulty Due To: Difficulty was anticipated and Difficult Airway- due to reduced neck mobility

## 2018-03-21 NOTE — Consult Note (Signed)
Reason for Consult:  Left hip fracture Referring Physician: Dr. Ashok Cordia, EDP  [name] is an 84 y.o. female.  HPI: The patient is a very pleasant 84 year old female who sustained an accidental mechanical fall yesterday when she got caught in a turnstile doorway.  She landed directly on her left hip and had significant left hip pain and inability to ambulate.  She was brought to the [facility] emergency room and found to have a nondisplaced intertrochanteric left hip fracture.  She has been on Plavix and took a dose yesterday.  She has no other significant active medical problems and issues.  She reports mainly just left hip pain.  [facility].  Orthopedic surgery is consulted for evaluation treatment of the left hip fracture.  At the bedside she reports mainly severe left hip pain.  She does ambulate with some type of cane I believe.  She is from [place], [place].  Past Medical History:  Diagnosis Date  . Hypertension     Past Surgical History:  Procedure Laterality Date  . BACK SURGERY    . CERVICAL SPINE SURGERY  1990    Family History  Problem Relation Age of Onset  . Diabetes Mother   . Stroke Mother   . Prostate cancer Brother     Social History:  reports that she has never smoked. She has never used smokeless tobacco. She reports that she does not drink alcohol. Her drug history is not on file.  Allergies:  Allergies  Allergen Reactions  . Penicillins Other (See Comments)    Unknown reaction    Medications: I have reviewed the patient's current medications.  Results for orders placed or performed during the hospital encounter of 03/20/18 (from the past 48 hour(s))  CBC     Status: Abnormal   Collection Time: 03/20/18 10:56 PM  Result Value Ref Range   WBC 9.0 4.0 - 10.5 K/uL   RBC 4.36 3.87 - 5.11 MIL/uL   Hemoglobin 11.7 (L) 12.0 - 15.0 g/dL   HCT 37.6 36.0 - 46.0 %   MCV 86.2 78.0 - 100.0 fL   MCH 26.8 26.0 -  34.0 pg   MCHC 31.1 30.0 - 36.0 g/dL   RDW 14.5 11.5 - 15.5 %   Platelets 191 150 - 400 K/uL    Comment: Performed at [facility], [place] [address], [place], [place] [address]  Basic metabolic panel     Status: Abnormal   Collection Time: 03/20/18 10:56 PM  Result Value Ref Range   Sodium 145 135 - 145 mmol/L   Potassium 3.8 3.5 - 5.1 mmol/L   Chloride 109 101 - 111 mmol/L   CO2 27 22 - 32 mmol/L   Glucose, Bld 141 (H) 65 - 99 mg/dL   BUN 41 (H) 6 - 20 mg/dL   Creatinine, Ser 1.11 (H) 0.44 - 1.00 mg/dL   Calcium 9.2 8.9 - 10.3 mg/dL   GFR calc non Af Amer 44 (L) >60 mL/min   GFR calc Af Amer 51 (L) >60 mL/min    Comment: (NOTE) The eGFR has been calculated using the CKD EPI equation. This calculation has not been validated in all clinical situations. eGFR's persistently <60 mL/min signify possible Chronic Kidney Disease.    Anion gap 9 5 - 15    Comment: Performed at [facility], [place] [address] [place], [place] [address]  Type and screen     Status: None  Collection Time: 03/20/18 10:56 PM  Result Value Ref Range   ABO/RH(D) B POS    Antibody Screen NEG    Sample Expiration      03/23/2018 Performed at [facility], [place] [address] [place], [place] [address]   ABO/Rh     Status: None   Collection Time: 03/20/18 10:56 PM  Result Value Ref Range   ABO/RH(D)      B POS Performed at [facility], [place] [address] [place], [place] [address]   CBC     Status: Abnormal   Collection Time: 03/21/18  4:01 AM  Result Value Ref Range   WBC 8.2 4.0 - 10.5 K/uL   RBC 4.26 3.87 - 5.11 MIL/uL   Hemoglobin 11.4 (L) 12.0 - 15.0 g/dL   HCT 36.3 36.0 - 46.0 %   MCV 85.2 78.0 - 100.0 fL   MCH 26.8 26.0 - 34.0 pg   MCHC 31.4 30.0 - 36.0 g/dL   RDW 14.5 11.5 - 15.5 %   Platelets 168 150 - 400 K/uL    Comment: Performed at [facility], [place] [address], [place], [place] [address]  Protime-INR      Status: None   Collection Time: 03/21/18  4:01 AM  Result Value Ref Range   Prothrombin Time 13.1 11.4 - 15.2 seconds   INR 1.00     Comment: Performed at [facility], [place] [address] [place], [place] [address]  Comprehensive metabolic panel     Status: Abnormal   Collection Time: 03/21/18  4:01 AM  Result Value Ref Range   Sodium 144 135 - 145 mmol/L   Potassium 3.5 3.5 - 5.1 mmol/L   Chloride 107 101 - 111 mmol/L   CO2 26 22 - 32 mmol/L   Glucose, Bld 187 (H) 65 - 99 mg/dL   BUN 37 (H) 6 - 20 mg/dL   Creatinine, Ser 0.88 0.44 - 1.00 mg/dL   Calcium 8.9 8.9 - 10.3 mg/dL   Total Protein 6.1 (L) 6.5 - 8.1 g/dL   Albumin 3.5 3.5 - 5.0 g/dL   AST 17 15 - 41 U/L   ALT 18 14 - 54 U/L   Alkaline Phosphatase 55 38 - 126 U/L   Total Bilirubin 0.7 0.3 - 1.2 mg/dL   GFR calc non Af Amer 59 (L) >60 mL/min   GFR calc Af Amer >60 >60 mL/min    Comment: (NOTE) The eGFR has been calculated using the CKD EPI equation. This calculation has not been validated in all clinical situations. eGFR's persistently <60 mL/min signify possible Chronic Kidney Disease.    Anion gap 11 5 - 15    Comment: Performed at [facility], [place] [address], [place],  [address]  Glucose, capillary     Status: Abnormal   Collection Time: 03/21/18  8:19 AM  Result Value Ref Range   Glucose-Capillary 118 (H) 65 - 99 mg/dL    Dg Chest 1 View  Result Date: 03/20/2018 CLINICAL DATA:  Preoperative chest radiograph prior to LEFT hip surgery. EXAM: CHEST  1 VIEW COMPARISON:  None. FINDINGS: The patient is slightly rotated. UPPER limits normal heart size noted. There is no evidence of focal airspace disease, pulmonary edema, suspicious pulmonary nodule/mass, pleural effusion, or pneumothorax. No acute bony abnormalities are identified. IMPRESSION: No active disease. Electronically Signed   By: Margarette Canada M.D.   On: 03/20/2018 22:41   Dg Foot Complete Right  Result Date:  03/21/2018 CLINICAL DATA:  [place] yesterday, RIGHT foot pain. EXAM: RIGHT FOOT COMPLETE - 3+ VIEW COMPARISON:  None. FINDINGS: Diffuse osteopenia limits characterization of osseous detail, however, there is no acute appearing fracture line or displaced fracture fragment. Sclerotic changes at the base of the fifth metatarsal bone suggests subacute or chronic/healed fracture. Adjacent soft tissues are unremarkable. IMPRESSION: 1. No acute findings, as detailed above. 2. Marked osteopenia, limiting characterization. 3. Probable subacute or chronic/healed fracture at the base of the fifth metatarsal bone. Electronically Signed   By: Franki Cabot M.D.   On: 03/21/2018 09:11   Dg Hip Unilat W Or W/o Pelvis 2-3 Views Left  Result Date: 03/20/2018 CLINICAL DATA:  Recent fall left hip pain, initial encounter EXAM: DG HIP (WITH OR WITHOUT PELVIS) 2-3V LEFT COMPARISON:  None. FINDINGS: There is a proximal left femoral fracture involving the intratrochanteric region without significant displacement. Pelvic ring is intact. No other focal abnormality is seen. IMPRESSION: Undisplaced intratrochanteric left femoral fracture. Electronically Signed   By: Inez Catalina M.D.   On: 03/20/2018 21:44   Independent review of the left hip x-rays show a nondisplaced intertrochanteric proximal femur fracture.   Review of Systems  All other systems reviewed and are negative.  Blood pressure (!) 140/52, pulse (!) 58, temperature 98.1 F (36.7 C), temperature source Oral, resp. rate 17, SpO2 92 %. Physical Exam  Constitutional: She is oriented to person, place, and time. She appears well-developed and well-nourished.  HENT:  Head: Normocephalic and atraumatic.  Eyes: Pupils are equal, round, and reactive to light. EOM are normal.  Neck: Normal range of motion. Neck supple.  Cardiovascular: Normal rate and regular rhythm.  Respiratory: Effort normal and breath sounds normal.  GI: Soft. Bowel sounds are normal.  Musculoskeletal:        Left hip: She exhibits decreased range of motion, decreased strength, tenderness and bony tenderness.  Neurological: She is alert and oriented to person, place, and time.  Skin: Skin is warm and dry.  Psychiatric: She has a normal mood and affect.   Although she has pain in her bilateral upper extremities, she lifts both arms above her head and I found no gross deficits on inspection and palpation.  Assessment/Plan: Left hip with nondisplaced intertrochanteric proximal femur fracture  We will proceed to surgery today for open reduction-internal fixation of this left hip fracture.  This can be done through 2 small incisions.  The goal will be getting her up later today or tomorrow with full weightbearing as tolerated with therapy.  Although she has been on Plavix, surgery cannot be delayed for 6 days like it can for elective surgeries when Plavix is being held.  She understands that she will likely need general anesthesia because of this.  Had a long and thorough discussion about the risk and benefits of surgery.  We will be proceeding to the OR today.  Mcarthur Rossetti 03/21/2018, 9:16 AM

## 2018-03-21 NOTE — Brief Op Note (Signed)
03/21/2018  12:20 PM  PATIENT:  [name]  84 y.o. female  PRE-OPERATIVE DIAGNOSIS:  left hip intertrochanteric fracture  POST-OPERATIVE DIAGNOSIS:  left hip intertrochanteric fracture  PROCEDURE:  Procedure(s): INTRAMEDULLARY (IM) NAIL INTERTROCHANTERIC (Left)  SURGEON:  Surgeon(s) and Role:    Kathryne Hitch* Letita Prentiss Y, MD - Primary  ANESTHESIA:   general  EBL:  100 mL   DICTATION: .Other Dictation: Dictation Number [id number]  PLAN OF CARE: Admit to inpatient   PATIENT DISPOSITION:  PACU - hemodynamically stable.   Delay start of Pharmacological VTE agent (>24hrs) due to surgical blood loss or risk of bleeding: no

## 2018-03-21 NOTE — Anesthesia Preprocedure Evaluation (Signed)
Anesthesia Evaluation  Patient identified by MRN, date of birth, ID band Patient awake    Reviewed: Allergy & Precautions, H&P , NPO status , Patient's Chart, lab work & pertinent test results  Airway Mallampati: II   Neck ROM: full    Dental   Pulmonary neg pulmonary ROS,    breath sounds clear to auscultation       Cardiovascular hypertension,  Rhythm:regular Rate:Normal     Neuro/Psych  Neuromuscular disease CVA    GI/Hepatic   Endo/Other  diabetes, Type 2  Renal/GU      Musculoskeletal   Abdominal   Peds  Hematology   Anesthesia Other Findings   Reproductive/Obstetrics                             Anesthesia Physical Anesthesia Plan  ASA: III  Anesthesia Plan: General   Post-op Pain Management:    Induction: Intravenous  PONV Risk Score and Plan: 3 and Ondansetron, Dexamethasone and Treatment may vary due to age or medical condition  Airway Management Planned: Oral ETT  Additional Equipment:   Intra-op Plan:   Post-operative Plan: Extubation in OR  Informed Consent: I have reviewed the patients History and Physical, chart, labs and discussed the procedure including the risks, benefits and alternatives for the proposed anesthesia with the patient or authorized representative who has indicated his/her understanding and acceptance.     Plan Discussed with: CRNA, Anesthesiologist and Surgeon  Anesthesia Plan Comments:         Anesthesia Quick Evaluation

## 2018-03-21 NOTE — Progress Notes (Signed)
PROGRESS NOTE    [name]  QIO:[id number] DOB: 11/07/1930 DOA: 03/20/2018 PCP: Patient, No Pcp Per   Brief Narrative:  HPI On 03/20/2018 by Dr. Therisa DoyneAnastassia Hartman Linda PalmsDarsena Hartman is a 84 y.o. female with medical history significant of DM2, polyneuropathy,  HTN, HLD, chronic back pain, hx of CVA with residual right side weakness.   Presented with  Fall after got walker stuck in revolving door.  Head injury denies loss of consciousness no other injury.  No chest pain shortness of breath patient is on Plavix for history of CVa no hx of stents.  Patient is not local she leaves in [place] Able to walk a flight of stairs without shortness of breath. No Chest pain, no shortness of breath.  Reports no hx of CAD,  Had CVA 2011 with residual  right side weakness   Assessment & Plan   Left hip fracture -Secondary to recent fall -Undisplaced intertrochanteric left femoral fracture -Orthopedics consulted and appreciated, plan is for surgery later today -Continue pain control  Essential hypertension -Continue amlodipine  History of CVA -Plavix currently held for surgery later today -Continue statin  Diabetes mellitus, type II -Continue insulin sliding scale CBG monitoring -Hemoglobin A1c 7  Right foot pain -Patient states that her right foot started hurting yesterday evening. -Obtained Xray of the right foot, no acute findings.  Marked osteopenia.  Probable subacute or chronic/healed fracture at the base of the fifth metatarsal -Continue supportive care and pain control  DVT Prophylaxis  SCDs  Code Status: DNR  Family Communication: None at bedside  Disposition Plan: Admitted. Pending surgery today. Dispo pending  Consultants Orthopedic surgery  Procedures  None  Antibiotics   Anti-infectives (From admission, onward)   None      Subjective:   [name] seen and examined today.  Complains of pain when she moves her left hip. Complains of right foot  pain that began last night. Denies current chest pain, shortness of breath, abdominal pain, N/V/D/C, dizziness, headache.    Objective:   Vitals:   03/20/18 2315 03/20/18 2330 03/21/18 0037 03/21/18 0614  BP: 139/63 (!) 147/61 (!) 138/56 (!) 140/52  Pulse: 65  62 (!) 58  Resp: 20 16 15 17   Temp:      TempSrc:      SpO2: 96%  97% 92%    Intake/Output Summary (Last 24 hours) at 03/21/2018 1039 Last data filed at 03/21/2018 0921 Gross per 24 hour  Intake -  Output 1500 ml  Net -1500 ml   There were no vitals filed for this visit.  Exam  General: Well developed, well nourished, NAD, appears stated age  HEENT: NCAT, PERRLA, EOMI, Anicteic Sclera, mucous membranes moist.   Neck: Supple  Cardiovascular: S1 S2 auscultated, 2/6SEM,  Regular rate and rhythm.  Respiratory: Clear to auscultation bilaterally with equal chest rise  Abdomen: Soft, nontender, nondistended, + bowel sounds  Extremities: warm dry without cyanosis clubbing or edema. Left hip TTP, LLE externally rotated.   Neuro: AAOx3, cranial nerves grossly intact. Strength testing deferred.   Skin: Without rashes exudates or nodules  Psych: Normal affect and demeanor with intact judgement and insight   Data Reviewed: I have personally reviewed following labs and imaging studies  CBC: Recent Labs  Lab 03/20/18 2256 03/21/18 0401  WBC 9.0 8.2  HGB 11.7* 11.4*  HCT 37.6 36.3  MCV 86.2 85.2  PLT 191 168   Basic Metabolic Panel: Recent Labs  Lab 03/20/18 2256 03/21/18 0401  NA 145  144  K 3.8 3.5  CL 109 107  CO2 27 26  GLUCOSE 141* 187*  BUN 41* 37*  CREATININE 1.11* 0.88  CALCIUM 9.2 8.9   GFR: CrCl cannot be calculated (Unknown ideal weight.). Liver Function Tests: Recent Labs  Lab 03/21/18 0401  AST 17  ALT 18  ALKPHOS 55  BILITOT 0.7  PROT 6.1*  ALBUMIN 3.5   No results for input(s): LIPASE, AMYLASE in the last 168 hours. No results for input(s): AMMONIA in the last 168  hours. Coagulation Profile: Recent Labs  Lab 03/21/18 0401  INR 1.00   Cardiac Enzymes: No results for input(s): CKTOTAL, CKMB, CKMBINDEX, TROPONINI in the last 168 hours. BNP (last 3 results) No results for input(s): PROBNP in the last 8760 hours. HbA1C: Recent Labs    03/21/18 0401  HGBA1C 7.0*   CBG: Recent Labs  Lab 03/21/18 0819  GLUCAP 118*   Lipid Profile: No results for input(s): CHOL, HDL, LDLCALC, TRIG, CHOLHDL, LDLDIRECT in the last 72 hours. Thyroid Function Tests: No results for input(s): TSH, T4TOTAL, FREET4, T3FREE, THYROIDAB in the last 72 hours. Anemia Panel: No results for input(s): VITAMINB12, FOLATE, FERRITIN, TIBC, IRON, RETICCTPCT in the last 72 hours. Urine analysis: No results found for: COLORURINE, APPEARANCEUR, LABSPEC, PHURINE, GLUCOSEU, HGBUR, BILIRUBINUR, KETONESUR, PROTEINUR, UROBILINOGEN, NITRITE, LEUKOCYTESUR Sepsis Labs: @LABRCNTIP (procalcitonin:4,lacticidven:4)  )No results found for this or any previous visit (from the past 240 hour(s)).    Radiology Studies: Dg Chest 1 View  Result Date: 03/20/2018 CLINICAL DATA:  Preoperative chest radiograph prior to LEFT hip surgery. EXAM: CHEST  1 VIEW COMPARISON:  None. FINDINGS: The patient is slightly rotated. UPPER limits normal heart size noted. There is no evidence of focal airspace disease, pulmonary edema, suspicious pulmonary nodule/mass, pleural effusion, or pneumothorax. No acute bony abnormalities are identified. IMPRESSION: No active disease. Electronically Signed   By: [name] M.D.   On: 03/20/2018 22:41   Dg Foot Complete Right  Result Date: 03/21/2018 CLINICAL DATA:  [place] yesterday, RIGHT foot pain. EXAM: RIGHT FOOT COMPLETE - 3+ VIEW COMPARISON:  None. FINDINGS: Diffuse osteopenia limits characterization of osseous detail, however, there is no acute appearing fracture line or displaced fracture fragment. Sclerotic changes at the base of the fifth metatarsal bone suggests subacute  or chronic/healed fracture. Adjacent soft tissues are unremarkable. IMPRESSION: 1. No acute findings, as detailed above. 2. Marked osteopenia, limiting characterization. 3. Probable subacute or chronic/healed fracture at the base of the fifth metatarsal bone. Electronically Signed   By: Bary Richard M.D.   On: 03/21/2018 09:11   Dg Hip Unilat W Or W/o Pelvis 2-3 Views Left  Result Date: 03/20/2018 CLINICAL DATA:  Recent fall left hip pain, initial encounter EXAM: DG HIP (WITH OR WITHOUT PELVIS) 2-3V LEFT COMPARISON:  None. FINDINGS: There is a proximal left femoral fracture involving the intratrochanteric region without significant displacement. Pelvic ring is intact. No other focal abnormality is seen. IMPRESSION: Undisplaced intratrochanteric left femoral fracture. Electronically Signed   By: Alcide Clever M.D.   On: 03/20/2018 21:44     Scheduled Meds: . [MAR Hold] amLODipine  10 mg Oral Daily  . [MAR Hold] atorvastatin  40 mg Oral q1800  . [MAR Hold] docusate sodium  100 mg Oral BID  . [MAR Hold] gabapentin  300 mg Oral TID  . [MAR Hold] insulin aspart  0-9 Units Subcutaneous Q4H  . [MAR Hold]  morphine injection  4 mg Intravenous Once  . [MAR Hold] senna  1 tablet Oral  BID   Continuous Infusions: . sodium chloride Stopped (03/21/18 0954)  . lactated ringers 75 mL/hr at 03/21/18 0956  . [MAR Hold] methocarbamol (ROBAXIN)  IV       LOS: 1 day   Time Spent in minutes   30 minutes  Destynie Toomey D.O. on 03/21/2018 at 10:39 AM  Between 7am to 7pm - Pager - [phone]  After 7pm go to [email or web address] - password [email or web address]  And look for the night coverage person covering for me after hours  [facility] Office  [phone]

## 2018-03-21 NOTE — Progress Notes (Signed)
Pt received from ER via [place] accompanied by family members.

## 2018-03-22 ENCOUNTER — Encounter (HOSPITAL_COMMUNITY): Payer: Self-pay | Admitting: Orthopaedic Surgery

## 2018-03-22 DIAGNOSIS — W010XXA Fall on same level from slipping, tripping and stumbling without subsequent striking against object, initial encounter: Secondary | ICD-10-CM

## 2018-03-22 DIAGNOSIS — D649 Anemia, unspecified: Secondary | ICD-10-CM

## 2018-03-22 DIAGNOSIS — R52 Pain, unspecified: Secondary | ICD-10-CM

## 2018-03-22 DIAGNOSIS — S81802A Unspecified open wound, left lower leg, initial encounter: Secondary | ICD-10-CM

## 2018-03-22 DIAGNOSIS — S81802D Unspecified open wound, left lower leg, subsequent encounter: Secondary | ICD-10-CM

## 2018-03-22 LAB — BASIC METABOLIC PANEL
ANION GAP: 9 (ref 5–15)
BUN: 34 mg/dL — ABNORMAL HIGH (ref 6–20)
CALCIUM: 8.2 mg/dL — AB (ref 8.9–10.3)
CO2: 24 mmol/L (ref 22–32)
Chloride: 102 mmol/L (ref 101–111)
Creatinine, Ser: 0.97 mg/dL (ref 0.44–1.00)
GFR calc non Af Amer: 52 mL/min — ABNORMAL LOW (ref >60)
GLUCOSE: 143 mg/dL — AB (ref 65–99)
POTASSIUM: 3.7 mmol/L (ref 3.5–5.1)
Sodium: 135 mmol/L (ref 135–145)

## 2018-03-22 LAB — CBC
HEMATOCRIT: 33.7 % — AB (ref 36.0–46.0)
HEMOGLOBIN: 10.5 g/dL — AB (ref 12.0–15.0)
MCH: 27 pg (ref 26.0–34.0)
MCHC: 31.2 g/dL (ref 30.0–36.0)
MCV: 86.6 fL (ref 78.0–100.0)
Platelets: 160 10*3/uL (ref 150–400)
RBC: 3.89 MIL/uL (ref 3.87–5.11)
RDW: 14.5 % (ref 11.5–15.5)
WBC: 8.1 10*3/uL (ref 4.0–10.5)

## 2018-03-22 LAB — GLUCOSE, CAPILLARY
GLUCOSE-CAPILLARY: 138 mg/dL — AB (ref 65–99)
GLUCOSE-CAPILLARY: 150 mg/dL — AB (ref 65–99)
GLUCOSE-CAPILLARY: 159 mg/dL — AB (ref 65–99)
GLUCOSE-CAPILLARY: 209 mg/dL — AB (ref 65–99)
Glucose-Capillary: 141 mg/dL — ABNORMAL HIGH (ref 65–99)
Glucose-Capillary: 183 mg/dL — ABNORMAL HIGH (ref 65–99)

## 2018-03-22 LAB — VITAMIN D 25 HYDROXY (VIT D DEFICIENCY, FRACTURES): VIT D 25 HYDROXY: 59.2 ng/mL (ref 30.0–100.0)

## 2018-03-22 MED ORDER — HYDROCODONE-ACETAMINOPHEN 5-325 MG PO TABS: 1.0000 | ORAL_TABLET | ORAL | 0 refills | Status: AC | PRN

## 2018-03-22 MED ORDER — INSULIN ASPART 100 UNIT/ML ~~LOC~~ SOLN
0.0000 [IU] | Freq: Three times a day (TID) | SUBCUTANEOUS | Status: DC
  Administered 2018-03-22: 3 [IU] via SUBCUTANEOUS
  Administered 2018-03-22: 2 [IU] via SUBCUTANEOUS
  Administered 2018-03-23: 1 [IU] via SUBCUTANEOUS
  Administered 2018-03-23 – 2018-03-24 (×3): 2 [IU] via SUBCUTANEOUS
  Administered 2018-03-24: 5 [IU] via SUBCUTANEOUS
  Administered 2018-03-25: 2 [IU] via SUBCUTANEOUS

## 2018-03-22 MED ORDER — ASPIRIN EC 81 MG PO TBEC
81.0000 mg | DELAYED_RELEASE_TABLET | Freq: Every day | ORAL | Status: DC
  Administered 2018-03-22 – 2018-03-25 (×4): 81 mg via ORAL
  Filled 2018-03-22 (×4): qty 1

## 2018-03-22 NOTE — Progress Notes (Signed)
OT Cancellation Note  Patient Details Name: [name] MRN: [id number] DOB: 10/18/1931   Cancelled Treatment:    Reason Eval/Treat Not Completed: Other (comment)  Pt was working with PT when OT checked on pt- will check back later this afternoon or next day. Lise AuerLori Hue Frick, [place]OT [phone] Einar CrowEDDING, Hardeep Reetz D 03/22/2018, 3:56 PM

## 2018-03-22 NOTE — Op Note (Signed)
[name], [name] MEDICAL RECORD [id number] ACCOUNT [id number] DATE OF BIRTH:[birth year] FACILITY: [facility] LOCATION: [facility] PHYSICIAN:Claudean Leavelle Aretha Parrot, MD  OPERATIVE REPORT  DATE OF PROCEDURE:  03/21/2018  PREOPERATIVE DIAGNOSIS:  Closed nondisplaced left hip intertrochanteric proximal femur fracture.  POSTOPERATIVE DIAGNOSIS:  Closed nondisplaced left hip intertrochanteric proximal femur fracture.  PROCEDURE PERFORMED:  Open reduction internal fixation of right intertrochanteric hip fracture with intramedullary nail and hip screw construct.  IMPLANTS:  Biomet 11 x 380 femoral nail with a 100 mm lag screw.  SURGEON:  Doneen Poisson, MD  ANESTHESIA:  General.  ANTIBIOTICS:  900 mg IV clindamycin.  ESTIMATED BLOOD LOSS:  Less than 100.  COMPLICATIONS:  None.  INDICATIONS:  The patient is an 84 year old female who had an unfortunate mechanical fall yesterday.  She landed on her left hip and was seen in the [facility] emergency room and found to have a nondisplaced intertrochanteric hip fracture.  I talked to  her and her son about the reasoning behind recommending surgery.  We had a long and thorough discussion about the risks and benefits of the surgery as well as our goals.  She was admitted to the hospitalist service and cleared for surgery.  DESCRIPTION OF PROCEDURE:  Informed consent was obtained, appropriate left hip was marked.  She was brought to the operating room where general anesthesia was obtained while she was on her stretcher.  She already had a Foley catheter was placed.  Next,  she was placed supine on the fracture table with a perineal post in place and her left operative leg in line skeletal traction, but no traction needed to be applied.  Her right hip was flexed and abducted out of the field with a well leg holder and  appropriate padding in the popliteal area.  We then assessed the fracture under direct fluoroscopy and found it to still remain  nondisplaced.  We then chose our rod keeping it sterile within the box so we could select our length and width holding it over  the canal.  Although this is a stable fracture, I still chose a long intramedullary nail knowing we did not have to place any distal interlocking screws.  We chose an 11 x 380 femoral nail for a left leg.  We then prepped her left hip area with DuraPrep  and sterile drapes.  Time out was called to identify the correct patient, correct left hip.  I then made a small incision proximal to the greater trochanter laterally over the hip and dissected down to the tip of the greater trochanter.  I then placed a  temporary guide rod antegrade fashion and verified it was placed under direct fluoroscopy.  We then used an initiating reamer in order to open up the proximal femoral canal.  We were then able to easily pass the femoral nail without reaming all the way  down the canal.  This again was placed under direct fluoroscopy and visualization.  Using the outrigger guide, we made a separate lateral incision and then placed a temporary guide pin from the lateral cortex of the femur traversing the fracture into an  adequate position of the femoral head and neck.  Again, this was verified under fluoroscopy as well.  We then chose a size 100 mm lag screw and drilled to this depth.  We placed a lag screw without difficulty and then removed the guide pin and removed  the outrigger guide.  I did not need to compress the fracture since it was nondisplaced.  I put the hip through internal and external rotation and it moved as a unit.  We were pleased with the placement overall.  We then irrigated both wounds with normal  saline solution, closed the deep tissue with 0 Vicryl followed by 2-0 Vicryl subcutaneous tissue, interrupted staples on the skin.  Xeroform well-padded sterile dressing was applied.  She was taken off of the fracture table, awakened, extubated, and  taken to recovery room in stable  condition.  All final counts were correct.  There were no complications noted.  TN/NUANCE  D:03/21/2018 T:03/21/2018 JOB:[id number]

## 2018-03-22 NOTE — Evaluation (Signed)
Occupational Therapy Evaluation Patient Details Name: [name] MRN: [id number] DOB: July 09, 1931 Today's Date: 03/22/2018    History of Present Illness 84 yo female admitted with L hip fx after sustaining a fall. S/P ORIF L hip 03/21/18. Hx of CVA with R residual weakness, DM, polyneuropathy, chronic back pain   Clinical Impression   Pt admitted with L hip fx. Pt currently with functional limitations due to the deficits listed below (see OT Problem List).  Pt will benefit from skilled OT to increase their safety and independence with ADL and functional mobility for ADL to facilitate discharge to venue listed below.      Follow Up Recommendations  SNF;Home health OT;Supervision/Assistance - 24 hour    Equipment Recommendations  3 in 1 bedside commode    Recommendations for Other Services       Precautions / Restrictions Precautions Precautions: Fall Restrictions Weight Bearing Restrictions: No Other Position/Activity Restrictions: WBAT      Mobility Bed Mobility Overal bed mobility: Needs Assistance Bed Mobility: Supine to Sit;Sit to Supine     Supine to sit: HOB elevated;Mod assist;Max assist Sit to supine: Mod assist;Max assist   General bed mobility comments: Pt needed significant A. Discussed need for care at home.  Family member did say family woiuld work to provide care for pt at home.  Transfers Overall transfer level: Needs assistance Equipment used: Rolling walker (2 wheeled) Transfers: Sit to/from Stand Sit to Stand: Max assist;+2 physical assistance;+2 safety/equipment Stand pivot transfers: Min assist;+2 physical assistance;+2 safety/equipment       General transfer comment: Max +2 to rise from low recliner. Pt was unable to take any ambulatory or pivotal steps. She used heel-toe shuffle technique to get from chair to bed. Assist to rise, stabilize, control descent. Multimodal cueing required.     Balance Overall balance assessment: Needs assistance          Standing balance support: Bilateral upper extremity supported Standing balance-Leahy Scale: Poor                             ADL either performed or assessed with clinical judgement   ADL                        Limited ADL eval as pt was back to bed then chose not to get back OOB.  Discussion regarding care needed at home with ADL activity including toileting, bathing and dressing.  Pt and family are working on this piece. OT will return in the morning to complete ADL portion of eval.                       Vision Patient Visual Report: No change from baseline              Pertinent Vitals/Pain Pain Assessment: 0-10 Pain Score: 5  Pain Location: L hip with activity Pain Descriptors / Indicators: Sharp;Sore;Aching;Grimacing;Operative site guarding Pain Intervention(s): Limited activity within patient's tolerance;Monitored during session     Hand Dominance     Extremity/Trunk Assessment Upper Extremity Assessment Upper Extremity Assessment: Generalized weakness;RUE deficits/detail RUE Deficits / Details: RUE with increased weakness from CVA.  Pt also with FM deficits on both UE.       Cervical / Trunk Assessment Cervical / Trunk Assessment: Normal   Communication Communication Communication: No difficulties   Cognition Arousal/Alertness: Awake/alert Behavior During Therapy: WFL for tasks assessed/performed Overall Cognitive  Status: Within Functional Limits for tasks assessed                                                Home Living Family/patient expects to be discharged to:: Unsure Living Arrangements: Alone Available Help at Discharge: Family;Available PRN/intermittently Type of Home: House Home Access: Stairs to enter;Level entry Entrance Stairs-Number of Steps: level entry-back entrance; stairs in front   Home Layout: One level               Home Equipment: Walker - 4 wheels;Cane - single  point          Prior Functioning/Environment Level of Independence: Independent with assistive device(s)        Comments: uses rollator vs cane PRN        OT Problem List: Decreased strength;Decreased activity tolerance;Impaired balance (sitting and/or standing);Decreased safety awareness;Decreased knowledge of precautions;Decreased knowledge of use of DME or AE      OT Treatment/Interventions: Self-care/ADL training;Patient/family education;Therapeutic activities    OT Goals(Current goals can be found in the care plan section) Acute Rehab OT Goals Patient Stated Goal: home. regain independence OT Goal Formulation: With patient Time For Goal Achievement: 03/29/18  OT Frequency: Min 2X/week   Barriers to D/C: Decreased caregiver support             AM-PAC PT "6 Clicks" Daily Activity     Outcome Measure Help from another person eating meals?: A Little Help from another person taking care of personal grooming?: A Little Help from another person toileting, which includes using toliet, bedpan, or urinal?: A Lot Help from another person bathing (including washing, rinsing, drying)?: A Little Help from another person to put on and taking off regular upper body clothing?: A Little Help from another person to put on and taking off regular lower body clothing?: A Lot 6 Click Score: 16   End of Session Nurse Communication: Mobility status  Activity Tolerance: Patient limited by lethargy Patient left: in bed;with call bell/phone within reach;with bed alarm set  OT Visit Diagnosis: Unsteadiness on feet (R26.81);Other abnormalities of gait and mobility (R26.89);Repeated falls (R29.6);History of falling (Z91.81);Muscle weakness (generalized) (M62.81)                Time: [id number] OT Time Calculation (min): 15 min Charges:  OT General Charges $OT Visit: 1 Visit OT Evaluation $OT Eval Moderate Complexity: 1 Mod G-Codes:     Lise AuerLori Evanthia Maund, [place] [phone]  Einar CrowEDDING, Casimer Russett  D 03/22/2018, 4:57 PM

## 2018-03-22 NOTE — Progress Notes (Signed)
PROGRESS NOTE    [name]  ZOX:[id number] DOB: 01/03/1931 DOA: 03/20/2018 PCP: Patient, No Pcp Per   Brief Narrative:  HPI On 03/20/2018 by Dr. Therisa DoyneAnastassia Doutova Linda PalmsDarsena Boeh is a 84 y.o. female with medical history significant of DM2, polyneuropathy,  HTN, HLD, chronic back pain, hx of CVA with residual right side weakness.  Presented with  Fall after got walker stuck in revolving door.  Head injury denies loss of consciousness no other injury.  No chest pain shortness of breath patient is on Plavix for history of CVa no hx of stents.  Patient is not local she leaves in [place] Able to walk a flight of stairs without shortness of breath. No Chest pain, no shortness of breath.  Reports no hx of CAD,  Had CVA 2011 with residual  right side weakness   Interim history Found to have left hip fracture, orthopedic surgery consulted.  Status post surgery.  Pending PT evaluation. Assessment & Plan   Left hip fracture -Secondary to recent fall -Undisplaced intertrochanteric left femoral fracture -Orthopedics consulted and appreciated -s/p ORIF R intertrochanteric hip fracture with intramedullary nail and hip screw contstruct -Patient can WBAT -Continue pain control -Pending PT  Normocytic Anemia/ Anemia secondary to acute blood loss -s/p recent surgery -hemoglobin currently 10.5 -Upon review of care everywhere, patient's hemoglobin appears to range between 11 and 12 -will continue to monitor CBC closely  Left lower extremity wound -patient follows with the wound clinic -will consult wound care   Essential hypertension -Continue amlodipine  History of CVA -Plavix currently held for surgery- will restart when anemia has stabilized  -Continue statin  Diabetes mellitus, type II -Continue insulin sliding scale CBG monitoring -Hemoglobin A1c 7  Right foot pain -Patient states that her right foot started hurting yesterday evening. -Obtained Xray of the right foot, no  acute findings.  Marked osteopenia.  Probable subacute or chronic/healed fracture at the base of the fifth metatarsal -Continue supportive care and pain control  DVT Prophylaxis  SCDs  Code Status: DNR  Family Communication: None at bedside  Disposition Plan: Admitted. Pending PT evaluation. Continue to monitor CBC.  Consultants Orthopedic surgery  Procedures  ORIF R intertrochanteric hip fracture with intramedullary nail and hip screw contstruct  Antibiotics   Anti-infectives (From admission, onward)   Start     Dose/Rate Route Frequency Ordered Stop   03/21/18 1800  clindamycin (CLEOCIN) IVPB 600 mg     600 mg 100 mL/hr over 30 Minutes Intravenous Every 6 hours 03/21/18 1405 03/22/18 0024   03/21/18 1052  clindamycin (CLEOCIN) 900 MG/50ML IVPB    Note to Pharmacy:  Mirian Moarver, [place]   : cabinet override      03/21/18 1052 03/21/18 1125      Subjective:   Linda Palmsarsena Mes[place] seen and examined today.  Patient alert complaining of pain in her right foot.  Is very anxious about getting up and walking.  Denies current chest pain, shortness of breath, abdominal pain, nausea or vomiting, diarrhea or constipation.  Objective:   Vitals:   03/22/18 0420 03/22/18 0625 03/22/18 0926 03/22/18 1050  BP: (!) 159/52 (!) 171/61 (!) 120/42 (!) 120/49  Pulse: (!) 53 60 (!) 54 (!) 52  Resp: 18 15 16 18   Temp: (!) 97 F (36.1 C) 97.8 F (36.6 C) 97.8 F (36.6 C) (!) 97.5 F (36.4 C)  TempSrc: Axillary Oral Oral Oral  SpO2: 100% 99% 97% 97%  Weight: 89.5 kg (197 lb 5 oz)  Height: 5\' 11"  (1.803 m)       Intake/Output Summary (Last 24 hours) at 03/22/2018 1052 Last data filed at 03/22/2018 0920 Gross per 24 hour  Intake 1740 ml  Output 2325 ml  Net -585 ml   Filed Weights   03/22/18 0420  Weight: 89.5 kg (197 lb 5 oz)   Exam  General: Well developed, well nourished, NAD, appears stated age  HEENT: NCAT, mucous membranes moist.   Neck: Supple  Cardiovascular: S1 S2  auscultated, 2/6 SEM. Regular rate and rhythm.  Respiratory: Clear to auscultation bilaterally with equal chest rise  Abdomen: Soft, nontender, nondistended, + bowel sounds  Extremities: warm dry without cyanosis clubbing or edema. Dressing in place on LLE.   Neuro: AAOx3, nonfocal  Psych: Normal affect and demeanor with intact judgement and insight  Data Reviewed: I have personally reviewed following labs and imaging studies  CBC: Recent Labs  Lab 03/20/18 2256 03/21/18 0401 03/22/18 0633  WBC 9.0 8.2 8.1  HGB 11.7* 11.4* 10.5*  HCT 37.6 36.3 33.7*  MCV 86.2 85.2 86.6  PLT 191 168 160   Basic Metabolic Panel: Recent Labs  Lab 03/20/18 2256 03/21/18 0401 03/22/18 0633  NA 145 144 135  K 3.8 3.5 3.7  CL 109 107 102  CO2 27 26 24   GLUCOSE 141* 187* 143*  BUN 41* 37* 34*  CREATININE 1.11* 0.88 0.97  CALCIUM 9.2 8.9 8.2*   GFR: Estimated Creatinine Clearance: 53.4 mL/min (by C-G formula based on SCr of 0.97 mg/dL). Liver Function Tests: Recent Labs  Lab 03/21/18 0401  AST 17  ALT 18  ALKPHOS 55  BILITOT 0.7  PROT 6.1*  ALBUMIN 3.5   No results for input(s): LIPASE, AMYLASE in the last 168 hours. No results for input(s): AMMONIA in the last 168 hours. Coagulation Profile: Recent Labs  Lab 03/21/18 0401  INR 1.00   Cardiac Enzymes: No results for input(s): CKTOTAL, CKMB, CKMBINDEX, TROPONINI in the last 168 hours. BNP (last 3 results) No results for input(s): PROBNP in the last 8760 hours. HbA1C: Recent Labs    03/21/18 0401  HGBA1C 7.0*   CBG: Recent Labs  Lab 03/21/18 1651 03/21/18 2008 03/21/18 2358 03/22/18 0412 03/22/18 0719  GLUCAP 308* 288* 171* 141* 150*   Lipid Profile: No results for input(s): CHOL, HDL, LDLCALC, TRIG, CHOLHDL, LDLDIRECT in the last 72 hours. Thyroid Function Tests: No results for input(s): TSH, T4TOTAL, FREET4, T3FREE, THYROIDAB in the last 72 hours. Anemia Panel: No results for input(s): VITAMINB12, FOLATE,  FERRITIN, TIBC, IRON, RETICCTPCT in the last 72 hours. Urine analysis: No results found for: COLORURINE, APPEARANCEUR, LABSPEC, PHURINE, GLUCOSEU, HGBUR, BILIRUBINUR, KETONESUR, PROTEINUR, UROBILINOGEN, NITRITE, LEUKOCYTESUR Sepsis Labs: @LABRCNTIP (procalcitonin:4,lacticidven:4)  )No results found for this or any previous visit (from the past 240 hour(s)).    Radiology Studies: Dg Chest 1 View  Result Date: 03/20/2018 CLINICAL DATA:  Preoperative chest radiograph prior to LEFT hip surgery. EXAM: CHEST  1 VIEW COMPARISON:  None. FINDINGS: The patient is slightly rotated. UPPER limits normal heart size noted. There is no evidence of focal airspace disease, pulmonary edema, suspicious pulmonary nodule/mass, pleural effusion, or pneumothorax. No acute bony abnormalities are identified. IMPRESSION: No active disease. Electronically Signed   By: Harmon Pier M.D.   On: 03/20/2018 22:41   Dg Foot Complete Right  Result Date: 03/21/2018 CLINICAL DATA:  Larey Seat yesterday, RIGHT foot pain. EXAM: RIGHT FOOT COMPLETE - 3+ VIEW COMPARISON:  None. FINDINGS: Diffuse osteopenia limits characterization of osseous detail, however,  there is no acute appearing fracture line or displaced fracture fragment. Sclerotic changes at the base of the fifth metatarsal bone suggests subacute or chronic/healed fracture. Adjacent soft tissues are unremarkable. IMPRESSION: 1. No acute findings, as detailed above. 2. Marked osteopenia, limiting characterization. 3. Probable subacute or chronic/healed fracture at the base of the fifth metatarsal bone. Electronically Signed   By: Bary Richard M.D.   On: 03/21/2018 09:11   Dg C-arm 1-60 Min-no Report  Result Date: 03/21/2018 Fluoroscopy was utilized by the requesting physician.  No radiographic interpretation.   Dg Hip Operative Unilat W Or W/o Pelvis Left  Result Date: 03/21/2018 CLINICAL DATA:  Intraoperative imaging for fixation of a left intertrochanteric fracture the patient  suffered in a fall 03/20/2018. Initial encounter. EXAM: OPERATIVE LEFT HIP (WITH PELVIS IF PERFORMED) 6 VIEWS TECHNIQUE: Fluoroscopic spot image(s) were submitted for interpretation post-operatively. COMPARISON:  Plain films left hip 03/20/2018. FINDINGS: Six fluoroscopic spot views demonstrate placement of a hip screw and long intramedullary nail for fixation of an intertrochanteric fracture. No acute abnormality. IMPRESSION: Intraoperative imaging for fixation of a left intertrochanteric fracture. Electronically Signed   By: Drusilla Kanner M.D.   On: 03/21/2018 12:22   Dg Hip Unilat W Or W/o Pelvis 2-3 Views Left  Result Date: 03/20/2018 CLINICAL DATA:  Recent fall left hip pain, initial encounter EXAM: DG HIP (WITH OR WITHOUT PELVIS) 2-3V LEFT COMPARISON:  None. FINDINGS: There is a proximal left femoral fracture involving the intratrochanteric region without significant displacement. Pelvic ring is intact. No other focal abnormality is seen. IMPRESSION: Undisplaced intratrochanteric left femoral fracture. Electronically Signed   By: Alcide Clever M.D.   On: 03/20/2018 21:44     Scheduled Meds: . amLODipine  10 mg Oral Daily  . aspirin EC  81 mg Oral Daily  . atorvastatin  40 mg Oral q1800  . clopidogrel  75 mg Oral Daily  . docusate sodium  100 mg Oral BID  . gabapentin  300 mg Oral TID  . insulin aspart  0-9 Units Subcutaneous TID WC  .  morphine injection  4 mg Intravenous Once  . senna  1 tablet Oral BID   Continuous Infusions: . methocarbamol (ROBAXIN)  IV       LOS: 2 days   Time Spent in minutes   30 minutes  Paizlie Klaus D.O. on 03/22/2018 at 10:52 AM  Between 7am to 7pm - Pager - [phone]  After 7pm go to [email or web address] - password [email or web address]  And look for the night coverage person covering for me after hours  [facility] Office  [phone]

## 2018-03-22 NOTE — Evaluation (Signed)
Physical Therapy Evaluation Patient Details Name: Linda PalmsDarsena Hartman MRN: [id number] DOB: 06/25/1931 Today's Date: 03/22/2018   History of Present Illness  84 yo female admitted with L hip fx after sustaining a fall. S/P ORIF L hip 03/21/18. Hx of CVA with R residual weakness, DM, polyneuropathy, chronic back pain    Clinical Impression  On eval, pt required Mod assist +2 for mobility. She was able to take a couple of steps with a RW and pivot to recliner with increased time and significant assistance. Pain rated 10/10 with activity. Pt c/o dizziness during session. Once safely seated in recliner, BP assessed-89/43 (made RN aware). Discussed d/c plan-pt stated she may d/c home with assistance from family. At this time, PT recommendation is for ST rehab at SNF if at all possible. Will follow and progress activity as tolerated.     Follow Up Recommendations SNF;Supervision/Assistance - 24 hour    Equipment Recommendations  Rolling walker with 5" wheels    Recommendations for Other Services       Precautions / Restrictions Precautions Precautions: Fall Restrictions Weight Bearing Restrictions: No Other Position/Activity Restrictions: WBAT      Mobility  Bed Mobility Overal bed mobility: Needs Assistance Bed Mobility: Supine to Sit     Supine to sit: HOB elevated;Mod assist;+2 for physical assistance;+2 for safety/equipment     General bed mobility comments: Assist for trunk and L LE. Increased time. Multimodal cueing for safety, technique. Utilized bedpad for scooting, positioning.   Transfers Overall transfer level: Needs assistance Equipment used: Rolling walker (2 wheeled) Transfers: Sit to/from UGI CorporationStand;Stand Pivot Transfers Sit to Stand: Mod assist;+2 physical assistance;+2 safety/equipment;From elevated surface         General transfer comment: Assist to rise, stabilize, control descent. Multimodal cueing required for safety, technique, sequence, safe RW use, hand/LE  placement. Increased time. Stand pivot, bed to recliner-recliner had to be brought up to pt to safely sit.   Ambulation/Gait Ambulation/Gait assistance: Mod assist;+2 physical assistance;+2 safety/equipment Ambulation Distance (Feet): 2 Feet Assistive device: Rolling walker (2 wheeled) Gait Pattern/deviations: Step-to pattern     General Gait Details: Assist to safely maneuver RW, stabilize/support pt in standing, advance L LE. Multimodal cueing for safety, technique, sequence. Pt c/o of dizziness so deferred any further attempt at ambulation.   Stairs            Wheelchair Mobility    Modified Rankin (Stroke Patients Only)       Balance Overall balance assessment: Needs assistance         Standing balance support: Bilateral upper extremity supported Standing balance-Leahy Scale: Poor                               Pertinent Vitals/Pain Pain Assessment: 0-10 Pain Score: 10-Worst pain ever Pain Location: L hip with activity Pain Descriptors / Indicators: Sharp;Sore;Aching;Grimacing;Operative site guarding Pain Intervention(s): Limited activity within patient's tolerance;Repositioned    Home Living Family/patient expects to be discharged to:: Unsure Living Arrangements: Alone Available Help at Discharge: Family;Available PRN/intermittently Type of Home: House Home Access: Stairs to enter;Level entry   Entrance Stairs-Number of Steps: level entry-back entrance; stairs in front Home Layout: One level Home Equipment: Walker - 4 wheels;Cane - single point      Prior Function Level of Independence: Independent with assistive device(s)         Comments: uses rollator vs cane PRN     Hand Dominance  Extremity/Trunk Assessment   Upper Extremity Assessment Upper Extremity Assessment: Defer to OT evaluation    Lower Extremity Assessment Lower Extremity Assessment: Generalized weakness(residual R sided weakness from prior CVA)     Cervical / Trunk Assessment Cervical / Trunk Assessment: Normal  Communication   Communication: No difficulties  Cognition Arousal/Alertness: Awake/alert Behavior During Therapy: WFL for tasks assessed/performed Overall Cognitive Status: Within Functional Limits for tasks assessed                                        General Comments      Exercises     Assessment/Plan    PT Assessment Patient needs continued PT services  PT Problem List Decreased strength;Decreased range of motion;Decreased balance;Decreased mobility;Decreased knowledge of use of DME;Decreased activity tolerance;Pain;Decreased knowledge of precautions       PT Treatment Interventions DME instruction;Gait training;Functional mobility training;Therapeutic activities;Balance training;Patient/family education;Therapeutic exercise    PT Goals (Current goals can be found in the Care Plan section)  Acute Rehab PT Goals Patient Stated Goal: home. regain independence PT Goal Formulation: With patient/family Time For Goal Achievement: 04/05/18 Potential to Achieve Goals: Good    Frequency Min 5X/week   Barriers to discharge        Co-evaluation               AM-PAC PT "6 Clicks" Daily Activity  Outcome Measure Difficulty turning over in bed (including adjusting bedclothes, sheets and blankets)?: Unable Difficulty moving from lying on back to sitting on the side of the bed? : Unable Difficulty sitting down on and standing up from a chair with arms (e.g., wheelchair, bedside commode, etc,.)?: Unable Help needed moving to and from a bed to chair (including a wheelchair)?: Total Help needed walking in hospital room?: Total Help needed climbing 3-5 steps with a railing? : Total 6 Click Score: 6    End of Session Equipment Utilized During Treatment: Gait belt Activity Tolerance: Patient limited by fatigue(limited by dizziness, pain) Patient left: in chair;with call bell/phone within  reach;with family/visitor present   PT Visit Diagnosis: Difficulty in walking, not elsewhere classified (R26.2);Muscle weakness (generalized) (M62.81);Pain Pain - Right/Left: Left Pain - part of body: Hip    Time: 4098-1191 PT Time Calculation (min) (ACUTE ONLY): 20 min   Charges:   PT Evaluation $PT Eval Moderate Complexity: 1 Mod     PT G Codes:          Rebeca Alert, MPT Pager: [phone]

## 2018-03-22 NOTE — Progress Notes (Signed)
Patient ID: Marquita PalmsDarsena Gulla, female   DOB: 04/13/1931, 84 y.o.   MRN: [id number] Patient reports being comfortable this am in bed.  Anticipates pain when trying to mobilize with therapy today.  Can put full weight as tolerated on her left hip when up with therapy.  Does live alone, but has family support.  Left hip dressings with some bloody drainage post-op.  Slight acute blood loss anemia, but tolerating well.

## 2018-03-22 NOTE — Progress Notes (Signed)
Physical Therapy Treatment Patient Details Name: Linda PalmsDarsena Kilker MRN: [id number] DOB: 06/12/1931 Today's Date: 03/22/2018    History of Present Illness 84 yo female admitted with L hip fx after sustaining a fall. S/P ORIF L hip 03/21/18. Hx of CVA with R residual weakness, DM, polyneuropathy, chronic back pain    PT Comments    Progressing slowly with mobility. Pt continues to require Mod-Max assist +2 for mobility. She has a history of R residual weakness after a CVA which is affecting her mobility in addition to L hip ORIF. Continue to recommend SNF.    Follow Up Recommendations  SNF;Supervision/Assistance - 24 hour (If pt chooses to return home instead of SNF, then HHPT)     Equipment Recommendations  Rolling walker with 5" wheels    Recommendations for Other Services       Precautions / Restrictions Precautions Precautions: Fall Restrictions Weight Bearing Restrictions: No Other Position/Activity Restrictions: WBAT    Mobility  Bed Mobility Overal bed mobility: Needs Assistance Bed Mobility: Sit to Supine     Supine to sit: HOB elevated;Mod assist;+2 for physical assistance;+2 for safety/equipment Sit to supine: Mod assist;+2 for physical assistance;+2 for safety/equipment   General bed mobility comments: Assist for trunk and L LE. Increased time. Multimodal cueing for safety, technique. Utilized bedpad for scooting, positioning.   Transfers Overall transfer level: Needs assistance Equipment used: Rolling walker (2 wheeled) Transfers: Sit to/from Stand Sit to Stand: Max assist;+2 physical assistance;+2 safety/equipment Stand pivot transfers: Min assist;+2 physical assistance;+2 safety/equipment       General transfer comment: Max +2 to rise from low recliner. Pt was unable to take any ambulatory or pivotal steps. She used heel-toe shuffle technique to get from chair to bed. Assist to rise, stabilize, control descent. Multimodal cueing required.    Ambulation/Gait Ambulation/Gait assistance: Mod assist;+2 physical assistance;+2 safety/equipment Ambulation Distance (Feet): 2 Feet Assistive device: Rolling walker (2 wheeled) Gait Pattern/deviations: Step-to pattern     General Gait Details: Assist to safely maneuver RW, stabilize/support pt in standing, advance L LE. Multimodal cueing for safety, technique, sequence. Pt c/o of dizziness so deferred any further attempt at ambulation.    Stairs             Wheelchair Mobility    Modified Rankin (Stroke Patients Only)       Balance Overall balance assessment: Needs assistance         Standing balance support: Bilateral upper extremity supported Standing balance-Leahy Scale: Poor                              Cognition Arousal/Alertness: Awake/alert Behavior During Therapy: WFL for tasks assessed/performed Overall Cognitive Status: Within Functional Limits for tasks assessed                                        Exercises      General Comments        Pertinent Vitals/Pain Pain Assessment: 0-10 Pain Score: 7  Pain Location: L hip with activity Pain Descriptors / Indicators: Sharp;Sore;Aching;Grimacing;Operative site guarding Pain Intervention(s): Limited activity within patient's tolerance;Monitored during session;Repositioned;Ice applied    Home Living Family/patient expects to be discharged to:: Unsure Living Arrangements: Alone Available Help at Discharge: Family;Available PRN/intermittently Type of Home: House Home Access: Stairs to enter;Level entry   Home Layout: One level Home Equipment: Dan HumphreysWalker -  4 wheels;Cane - single point      Prior Function Level of Independence: Independent with assistive device(s)      Comments: uses rollator vs cane PRN   PT Goals (current goals can now be found in the care plan section) Acute Rehab PT Goals Patient Stated Goal: home. regain independence PT Goal Formulation: With  patient/family Time For Goal Achievement: 04/05/18 Potential to Achieve Goals: Good Progress towards PT goals: Progressing toward goals    Frequency    Min 5X/week      PT Plan Current plan remains appropriate    Co-evaluation              AM-PAC PT "6 Clicks" Daily Activity  Outcome Measure  Difficulty turning over in bed (including adjusting bedclothes, sheets and blankets)?: Unable Difficulty moving from lying on back to sitting on the side of the bed? : Unable Difficulty sitting down on and standing up from a chair with arms (e.g., wheelchair, bedside commode, etc,.)?: Unable Help needed moving to and from a bed to chair (including a wheelchair)?: Total Help needed walking in hospital room?: Total Help needed climbing 3-5 steps with a railing? : Total 6 Click Score: 6    End of Session Equipment Utilized During Treatment: Gait belt Activity Tolerance: Patient limited by fatigue;Patient limited by pain Patient left: in bed;with call bell/phone within reach;with family/visitor present   PT Visit Diagnosis: Difficulty in walking, not elsewhere classified (R26.2);Muscle weakness (generalized) (M62.81);Pain Pain - Right/Left: Left Pain - part of body: Hip     Time: 1610-9604 PT Time Calculation (min) (ACUTE ONLY): 22 min  Charges:  $Therapeutic Activity: 8-22 mins                    G Codes:          Rebeca Alert, MPT Pager: [phone]

## 2018-03-22 NOTE — Discharge Instructions (Signed)
Full weight bearing as tolerated left hip. Dry dressing daily as needed to left hip incisions. Can get incisions wet in the shower.

## 2018-03-22 NOTE — Consult Note (Signed)
WOC Nurse wound consult note Reason for Consult: Left anterior LE wound. Was seen X1 [facility] [place] [place]. Where a silver foam dressing was placed and secured, then topped by a light compression stockinet. Significant improvement in 1 week. Wound type:venous insufficiency Pressure Injury POA: N/A Measurement:0.4cm round x 0.1cm Wound bed: pink, moist Drainage (amount, consistency, odor) scant serous on old dressing Periwound:intact with evidence of previous wound healing.  Patient's family member takes a Building services engineerphoto during my visit and shows it to patient who is amazed at the rapid recovery using only mild pressure and compression. Dressing procedure/placement/frequency: I will [facility] and cover with silver hydrofiber, secured with Kerlix and ask nursing to apply the compression sock/stockinete.  WOC nursing team will not follow, but will remain available to this patient, the nursing and medical teams.  Please re-consult if needed. Thanks, Ladona MowLaurie Mackensi Mahadeo, MSN, RN, GNP, Hans EdenCWOCN, CWON-AP, FAAN  Pager# [phone]

## 2018-03-22 NOTE — Progress Notes (Addendum)
CSW met with patient at bedside to discuss discharge planning to SNF. Patient informed CSW she is from [facility] in [place] for a conference . Patient reports at this time she plans to return home with Home Health services, preferable [facility]. She reports living alone but has arranged for her daughter to stay with her during the evenings. CSW informed RNCM.  No other CSW needs identified at this time. CSW signing off.   Kathrin Greathouse, Latanya Presser, MSW Clinical Social Worker  [phone] 03/22/2018  2:53 PM

## 2018-03-23 LAB — BASIC METABOLIC PANEL
Anion gap: 9 (ref 5–15)
BUN: 32 mg/dL — AB (ref 6–20)
CO2: 27 mmol/L (ref 22–32)
CREATININE: 0.93 mg/dL (ref 0.44–1.00)
Calcium: 8.3 mg/dL — ABNORMAL LOW (ref 8.9–10.3)
Chloride: 104 mmol/L (ref 101–111)
GFR calc Af Amer: >60 mL/min (ref >60)
GFR calc non Af Amer: 55 mL/min — ABNORMAL LOW (ref >60)
GLUCOSE: 156 mg/dL — AB (ref 65–99)
POTASSIUM: 3.8 mmol/L (ref 3.5–5.1)
Sodium: 140 mmol/L (ref 135–145)

## 2018-03-23 LAB — RETICULOCYTES
RBC.: 3.5 MIL/uL — ABNORMAL LOW (ref 3.87–5.11)
RETIC COUNT ABSOLUTE: 45.5 10*3/uL (ref 19.0–186.0)
Retic Ct Pct: 1.3 % (ref 0.4–3.1)

## 2018-03-23 LAB — GLUCOSE, CAPILLARY
Glucose-Capillary: 145 mg/dL — ABNORMAL HIGH (ref 65–99)
Glucose-Capillary: 194 mg/dL — ABNORMAL HIGH (ref 65–99)
Glucose-Capillary: 176 mg/dL — ABNORMAL HIGH (ref 65–99)
Glucose-Capillary: 212 mg/dL — ABNORMAL HIGH (ref 65–99)

## 2018-03-23 LAB — IRON AND TIBC
IRON: 18 ug/dL — AB (ref 28–170)
SATURATION RATIOS: 7 % — AB (ref 10.4–31.8)
TIBC: 263 ug/dL (ref 250–450)
UIBC: 245 ug/dL

## 2018-03-23 LAB — CBC
HEMATOCRIT: 30.2 % — AB (ref 36.0–46.0)
Hemoglobin: 9.4 g/dL — ABNORMAL LOW (ref 12.0–15.0)
MCH: 26.9 pg (ref 26.0–34.0)
MCHC: 31.1 g/dL (ref 30.0–36.0)
MCV: 86.3 fL (ref 78.0–100.0)
Platelets: 156 10*3/uL (ref 150–400)
RBC: 3.5 MIL/uL — ABNORMAL LOW (ref 3.87–5.11)
RDW: 14.5 % (ref 11.5–15.5)
WBC: 6.9 10*3/uL (ref 4.0–10.5)

## 2018-03-23 LAB — FOLATE: FOLATE: 14.9 ng/mL (ref >5.9)

## 2018-03-23 LAB — FERRITIN: Ferritin: 49 ng/mL (ref 11–307)

## 2018-03-23 LAB — VITAMIN B12: Vitamin B-12: 772 pg/mL (ref 180–914)

## 2018-03-23 MED ORDER — FERROUS SULFATE 325 (65 FE) MG PO TABS
325.0000 mg | ORAL_TABLET | Freq: Two times a day (BID) | ORAL | Status: DC
  Administered 2018-03-23 – 2018-03-25 (×4): 325 mg via ORAL
  Filled 2018-03-23 (×4): qty 1

## 2018-03-23 NOTE — Progress Notes (Signed)
Patient ID: Marquita PalmsDarsena Obremski, female   DOB: 10/25/1930, 84 y.o.   MRN: [id number] Very slow mobility secondary to pain, her age, and previous stroke.  Therapy has recommended skilled nursing, but patient  Would rather go home and states that she has family support.  No acute changes from Ortho standpoint.  Can be discharged when medically cleared and cleared by therapy.  Will need to have close supervision at home and HHPT.

## 2018-03-23 NOTE — Progress Notes (Signed)
Physical Therapy Treatment Patient Details Name: [name] MRN: [id number] DOB: 06/21/1931 Today's Date: 03/23/2018    History of Present Illness 84 yo female admitted with L hip fx after sustaining a fall. S/P ORIF L hip 03/21/18. Hx of CVA with R residual weakness, DM, polyneuropathy, chronic back pain    PT Comments    POD # 2 pm session General transfer comment: + 2 side by side asssit from recliner with 75% VC's on proper hand placement to trans pivot to [facility] + 2 side by side assist Total Assist with great difficulty weight shifting and complete 1/4 pivot to [facility].  Assisted off BSC back to bed.  Max c/o fatigue after this activity.  General Gait Details: unable to any steps this afternoon due to fatigue and pain level  Follow Up Recommendations        Equipment Recommendations       Recommendations for Other Services       Precautions / Restrictions      Mobility  Bed Mobility Overal bed mobility: Needs Assistance Bed Mobility: Sit to Supine       Sit to supine: Max assist;Total assist;+2 for physical assistance;+2 for safety/equipment   General bed mobility comments: assisted back to bed  Transfers Overall transfer level: Needs assistance Equipment used: None;Rolling walker (2 wheeled) Transfers: Stand Pivot Transfers;Sit to/from Stand Sit to Stand: Max assist;+2 physical assistance;+2 safety/equipment Stand pivot transfers: Min assist;+2 physical assistance;+2 safety/equipment       General transfer comment: + 2 side by side asssit from recliner with 75% VC's on proper hand placement to trans pivot to [facility] + 2 side by side assist Total Assist with great difficulty weight shifting and complete 1/4 pivot to [facility].  Assisted off BSC back to bed.  Max c/o fatigue after this activity.    Ambulation/Gait             General Gait Details: unable to any steps this afternoon due to fatigue and pain level   Stairs             Wheelchair Mobility     Modified Rankin (Stroke Patients Only)                                                                                                   End of Session  in bed with call light in reach             Time: 1530-1545 PT Time Calculation (min) (ACUTE ONLY): 15 min  Charges:  $Therapeutic Activity: 8-22 mins                    G Codes:       Felecia ShellingLori Santa Abdelrahman  PTA [facility]  Acute  Rehab Pager      [phone]

## 2018-03-23 NOTE — Progress Notes (Signed)
Occupational Therapy Treatment Patient Details Name: [name] MRN: [id number] DOB: 07-03-1931 Today's Date: 03/23/2018    History of present illness 84 yo female admitted with L hip fx after sustaining a fall. S/P ORIF L hip 03/21/18. Hx of CVA with R residual weakness, DM, polyneuropathy, chronic back pain   OT comments  Pt has decided she will need SNF- OT agrees  Follow Up Recommendations  SNF    Equipment Recommendations  3 in 1 bedside commode    Recommendations for Other Services      Precautions / Restrictions Precautions Precautions: Fall Restrictions Weight Bearing Restrictions: No Other Position/Activity Restrictions: WBAT       Mobility Bed Mobility Overal bed mobility: Needs Assistance Bed Mobility: Supine to Sit;Sit to Supine     Supine to sit: HOB elevated;Mod assist;Max assist        Transfers Overall transfer level: Needs assistance Equipment used: Rolling walker (2 wheeled) Transfers: Sit to/from [employer] Transfers Sit to Stand: Max assist;+2 physical assistance;+2 safety/equipment Stand pivot transfers: Min assist;+2 physical assistance;+2 safety/equipment                ADL either performed or assessed with clinical judgement   ADL Overall ADL's : Needs assistance/impaired Eating/Feeding: Set up;Sitting   Grooming: Set up;Sitting   Upper Body Bathing: Minimal assistance;Sitting   Lower Body Bathing: Maximal assistance;+2 for safety/equipment;+2 for physical assistance;Sit to/from stand;Cueing for sequencing;Cueing for safety   Upper Body Dressing : Minimal assistance;Sitting   Lower Body Dressing: Maximal assistance;Cueing for safety;+2 for safety/equipment;+2 for physical assistance   Toilet Transfer: RW;+2 for safety/equipment;+2 for physical assistance;Maximal assistance Toilet Transfer Details (indicate cue type and reason): bed to chair Toileting- Clothing Manipulation and Hygiene: +2 for physical assistance;+2  for safety/equipment;Total assistance               Vision Patient Visual Report: No change from baseline     Perception     Praxis      Cognition Arousal/Alertness: Awake/alert Behavior During Therapy: WFL for tasks assessed/performed Overall Cognitive Status: Within Functional Limits for tasks assessed                                                     Pertinent Vitals/ Pain       Pain Score: 4  Pain Location: L hip with activity Pain Descriptors / Indicators: Sharp;Sore;Aching;Grimacing Pain Intervention(s): Monitored during session;Limited activity within patient's tolerance  Home Living                                              Frequency  Min 2X/week        Progress Toward Goals  OT Goals(current goals can now be found in the care plan section)        Plan Discharge plan needs to be updated    Co-evaluation                 AM-PAC PT "6 Clicks" Daily Activity     Outcome Measure   Help from another person eating meals?: A Little Help from another person taking care of personal grooming?: A Little Help from another person toileting, which includes using toliet, bedpan, or urinal?: A Lot  Help from another person bathing (including washing, rinsing, drying)?: A Little Help from another person to put on and taking off regular upper body clothing?: A Little Help from another person to put on and taking off regular lower body clothing?: A Lot 6 Click Score: 16    End of Session Equipment Utilized During Treatment: Rolling walker  OT Visit Diagnosis: Unsteadiness on feet (R26.81);Other abnormalities of gait and mobility (R26.89);Repeated falls (R29.6);History of falling (Z91.81);Muscle weakness (generalized) (M62.81)   Activity Tolerance Patient tolerated treatment well   Patient Left with call bell/phone within reach;in chair;with family/visitor present   Nurse Communication Mobility status         Time: 0925-1000 OT Time Calculation (min): 35 min  Charges: OT General Charges $OT Visit: 1 Visit OT Treatments $Self Care/Home Management : 23-37 mins  DeadwoodLori Abbigael Hartman, [place]OT [phone]   Einar CrowEDDING, Dimitrius Steedman D 03/23/2018, 10:34 AM

## 2018-03-23 NOTE — NC FL2 (Signed)
[place] MEDICAID FL2 LEVEL OF CARE SCREENING TOOL     IDENTIFICATION  Patient Name: [name] Birthdate: [birth year] Sex: female Admission Date (Current Location): 03/20/2018  [facility] and [place] Number:  Producer, television/film/video and Address:  [facility],  [address], [place] [address]      Provider Number: [id number]  Attending Physician Name and Address:  Edsel Petrin, DO  Relative Name and Phone Number:       Current Level of Care: Hospital Recommended Level of Care: Skilled Nursing Facility Prior Approval Number:    Date Approved/Denied:   PASRR Number: [id number]  Discharge Plan: SNF    Current Diagnoses: Patient Active Problem List   Diagnosis Date Noted  . Fall from slip, trip, or stumble, initial encounter   . Pain   . Anemia   . Wound of left leg   . Closed nondisplaced intertrochanteric fracture of left femur (HCC)   . Essential hypertension 03/20/2018  . Closed left hip fracture (HCC) 03/20/2018  . DM type 2 with diabetic peripheral neuropathy (HCC) 03/20/2018  . Right hemiplegia (HCC) 03/20/2018  . History of CVA with residual deficit 03/20/2018  . Hip fracture (HCC) 03/20/2018    Orientation RESPIRATION BLADDER Height & Weight     Self, Time, Situation, Place  Normal Continent Weight: 197 lb 5 oz (89.5 kg) Height:  5\' 11"  (180.3 cm)  BEHAVIORAL SYMPTOMS/MOOD NEUROLOGICAL BOWEL NUTRITION STATUS      Continent Diet(Diet Carb Modified. )  AMBULATORY STATUS COMMUNICATION OF NEEDS Skin   Extensive Assist Verbally Normal                       Personal Care Assistance Level of Assistance  Bathing, Feeding, Dressing Bathing Assistance: Limited assistance Feeding assistance: Independent Dressing Assistance: Limited assistance     Functional Limitations Info  Sight, Speech, Hearing Sight Info: Adequate Hearing Info: Adequate Speech Info: Adequate    SPECIAL CARE FACTORS FREQUENCY  PT (By licensed PT), OT (By  licensed OT)     PT Frequency: 5x/week OT Frequency: 5x/week             Contractures Contractures Info: Not present    Additional Factors Info  Code Status, Allergies, Insulin Sliding Scale Code Status Info: DNR Allergies Info: Allergies: Penicillins   Insulin Sliding Scale Info: Yes, see med. list       Current Medications (03/23/2018):  This is the current hospital active medication list Current Facility-Administered Medications  Medication Dose Route Frequency Provider Last Rate Last Dose  . acetaminophen (TYLENOL) tablet 325-650 mg  325-650 mg Oral Q6H PRN Kathryne Hitch, MD      . amLODipine (NORVASC) tablet 10 mg  10 mg Oral Daily Kathryne Hitch, MD   10 mg at 03/22/18 0935  . aspirin EC tablet 81 mg  81 mg Oral Daily Kathryne Hitch, MD   81 mg at 03/22/18 5621  . atorvastatin (LIPITOR) tablet 40 mg  40 mg Oral q1800 Kathryne Hitch, MD   40 mg at 03/22/18 1736  . clopidogrel (PLAVIX) tablet 75 mg  75 mg Oral Daily Kathryne Hitch, MD   75 mg at 03/22/18 0935  . docusate sodium (COLACE) capsule 100 mg  100 mg Oral BID Kathryne Hitch, MD   100 mg at 03/22/18 2326  . gabapentin (NEURONTIN) capsule 300 mg  300 mg Oral TID Kathryne Hitch, MD   300 mg at 03/22/18 2326  .  HYDROcodone-acetaminophen (NORCO) 7.5-325 MG per tablet 1-2 tablet  1-2 tablet Oral Q4H PRN Kathryne HitchBlackman, Christopher Y, MD      . HYDROcodone-acetaminophen (NORCO/VICODIN) 5-325 MG per tablet 1-2 tablet  1-2 tablet Oral Q4H PRN Kathryne HitchBlackman, Christopher Y, MD   1 tablet at 03/22/18 2326  . insulin aspart (novoLOG) injection 0-9 Units  0-9 Units Subcutaneous TID WC Kathryne HitchBlackman, Christopher Y, MD   1 Units at 03/23/18 [phone]  . menthol-cetylpyridinium (CEPACOL) lozenge 3 mg  1 lozenge Oral PRN Kathryne HitchBlackman, Christopher Y, MD       Or  . phenol (CHLORASEPTIC) mouth spray 1 spray  1 spray Mouth/Throat PRN Kathryne HitchBlackman, Christopher Y, MD      . methocarbamol (ROBAXIN) tablet  500 mg  500 mg Oral Q6H PRN Kathryne HitchBlackman, Christopher Y, MD   500 mg at 03/22/18 1849   Or  . methocarbamol (ROBAXIN) 500 mg in dextrose 5 % 50 mL IVPB  500 mg Intravenous Q6H PRN Kathryne HitchBlackman, Christopher Y, MD      . metoCLOPramide (REGLAN) tablet 5-10 mg  5-10 mg Oral Q8H PRN Kathryne HitchBlackman, Christopher Y, MD       Or  . metoCLOPramide (REGLAN) injection 5-10 mg  5-10 mg Intravenous Q8H PRN Kathryne HitchBlackman, Christopher Y, MD      . morphine 2 MG/ML injection 0.5-1 mg  0.5-1 mg Intravenous Q2H PRN Kathryne HitchBlackman, Christopher Y, MD      . morphine 4 MG/ML injection 4 mg  4 mg Intravenous Once Kathryne HitchBlackman, Christopher Y, MD   Stopped at 03/20/18 2217  . ondansetron (ZOFRAN) tablet 4 mg  4 mg Oral Q6H PRN Kathryne HitchBlackman, Christopher Y, MD       Or  . ondansetron [facility](ZOFRAN) injection 4 mg  4 mg Intravenous Q6H PRN Kathryne HitchBlackman, Christopher Y, MD      . polyethylene glycol (MIRALAX / GLYCOLAX) packet 17 g  17 g Oral Daily PRN Kathryne HitchBlackman, Christopher Y, MD      . senna [facility](SENOKOT) tablet 8.6 mg  1 tablet Oral BID Kathryne HitchBlackman, Christopher Y, MD   8.6 mg at 03/22/18 2326     Discharge Medications: Please see discharge summary for a list of discharge medications.  Relevant Imaging Results:  Relevant Lab Results:   Additional Information SSN:[id number]  Clearance CootsNicole A Emmelyn Schmale, LCSW

## 2018-03-23 NOTE — Progress Notes (Signed)
Physical Therapy Treatment Patient Details Name: [name] MRN: [id number] DOB: 01/07/1931 Today's Date: 03/23/2018    History of Present Illness 84 yo female admitted with L hip fx after sustaining a fall. S/P ORIF L hip 03/21/18. Hx of CVA with R residual weakness, DM, polyneuropathy, chronic back pain    PT Comments    POD # 2 ORIF S/p CVA R Hemi pt fell in a revolving door using her walker.  Pt OOB in recliner.  General transfer comment: + 2 side by side asssit from recliner with 75% VC's on proper hand placement to [facility] + 2 side by side assist Total Assist with great difficulty weight shifting and complete 1/4 [facility].  Assisted off BSC back to recliner.  Max c/o fatigue after this activity.  General Gait Details: attempted however unable to weight shift enough to advance either LE due to pain and fatigue.  Transfer only performed.    Follow Up Recommendations  SNF(pt and family have agreed and realize pt is unable to function at home)     Equipment Recommendations       Recommendations for Other Services       Precautions / Restrictions Precautions Precautions: Fall Precaution Comments: old CVA R hemi amb prior to fall Restrictions Weight Bearing Restrictions: No Other Position/Activity Restrictions: WBAT    Mobility  Bed Mobility Overal bed mobility: Needs Assistance Bed Mobility: Supine to Sit;Sit to Supine     Supine to sit: HOB elevated;Mod assist;Max assist     General bed mobility comments: OOB in recliner  Transfers Overall transfer level: Needs assistance Equipment used: None;Rolling walker (2 wheeled) Transfers: Stand Pivot Transfers;Sit to/from Stand Sit to Stand: Max assist;+2 physical assistance;+2 safety/equipment Stand pivot transfers: Min assist;+2 physical assistance;+2 safety/equipment       General transfer comment: + 2 side by side asssit from recliner with 75% VC's on proper hand placement to trans pivot to [facility] + 2  side by side assist Total Assist with great difficulty weight shifting and complete 1/4 pivot to [facility].  Assisted off BSC back to recliner.  Max c/o fatigue after this activity.    Ambulation/Gait             General Gait Details: attempted however unable to weight shift enough to advance either LE due to pain and fatigue.  Transfer only performed.     Stairs             Wheelchair Mobility    Modified Rankin (Stroke Patients Only)       Balance                                            Cognition Arousal/Alertness: Awake/alert Behavior During Therapy: WFL for tasks assessed/performed Overall Cognitive Status: Within Functional Limits for tasks assessed                                        Exercises      General Comments        Pertinent Vitals/Pain Pain Assessment: 0-10 Pain Score: 7  Pain Location: L hip with activity Pain Descriptors / Indicators: Sharp;Sore;Aching;Grimacing Pain Intervention(s): Limited activity within patient's tolerance;Repositioned;Ice applied;Patient requesting pain meds-RN notified    Home Living  Prior Function            PT Goals (current goals can now be found in the care plan section) Progress towards PT goals: Progressing toward goals    Frequency    Min 5X/week      PT Plan Current plan remains appropriate    Co-evaluation              AM-PAC PT "6 Clicks" Daily Activity  Outcome Measure  Difficulty turning over in bed (including adjusting bedclothes, sheets and blankets)?: Unable Difficulty moving from lying on back to sitting on the side of the bed? : Unable Difficulty sitting down on and standing up from a chair with arms (e.g., wheelchair, bedside commode, etc,.)?: Unable Help needed moving to and from a bed to chair (including a wheelchair)?: Total Help needed walking in hospital room?: Total Help needed climbing 3-5 steps with a  railing? : Total 6 Click Score: 6    End of Session Equipment Utilized During Treatment: Gait belt Activity Tolerance: Patient limited by fatigue;Patient limited by pain Patient left: in chair;with chair alarm set;with family/visitor present;with call bell/phone within reach Nurse Communication: (NT pt used BSC) PT Visit Diagnosis: Difficulty in walking, not elsewhere classified (R26.2);Muscle weakness (generalized) (M62.81);Pain Pain - Right/Left: Left Pain - part of body: Hip     Time: 1610-9604 PT Time Calculation (min) (ACUTE ONLY): 28 min  Charges:  $Therapeutic Activity: 23-37 mins                    G Codes:       [name]  PTA [facility]  Acute  Rehab Pager      [phone]

## 2018-03-23 NOTE — Progress Notes (Addendum)
Patient and son agreeable to SNF placement, [place]. [facility]   CSW sent requested documentation to SNF. Under review.   4:37 Csw updated patient and her son Smitty CordsBruce. [facility] repost the patient information is still under review. They will have a definitve answer in the a.m.  The facility will have a bed 6/6. CSW informed son of other offers if patient is ready in the a.m. He is agreeable to [facility]F, if the patient is ready 6/5.  Vivi BarrackNicole Cynthia Cogle, Theresia MajorsLCSWA, MSW Clinical Social Worker  [phone] 03/23/2018  9:59 AM

## 2018-03-23 NOTE — Progress Notes (Signed)
PROGRESS NOTE    [name] Pauwels  ZOX:[id number] DOB: 11/28/1930 DOA: 03/20/2018 PCP: Patient, No Pcp Per   Brief Narrative:  HPI On 03/20/2018 by Dr. Therisa DoyneAnastassia Doutova [name] Phimmasone is a 84 y.o. female with medical history significant of DM2, polyneuropathy,  HTN, HLD, chronic back pain, hx of CVA with residual right side weakness.  Presented with  Fall after got walker stuck in revolving door.  Head injury denies loss of consciousness no other injury.  No chest pain shortness of breath patient is on Plavix for history of CVa no hx of stents.  Patient is not local she leaves in [place] Able to walk a flight of stairs without shortness of breath. No Chest pain, no shortness of breath.  Reports no hx of CAD,  Had CVA 2011 with residual  right side weakness   Interim history Found to have left hip fracture, orthopedic surgery consulted.  Status post surgery.  PT/OT recommended SNF, however [facility] Also developed anemia secondary to surgery, continuing to follow hemoglobin.  Assessment & Plan   Left hip fracture -Secondary to recent fall -Undisplaced intertrochanteric left femoral fracture -Orthopedics consulted and appreciated -s/p ORIF R intertrochanteric hip fracture with intramedullary nail and hip screw contstruct -Patient can WBAT -Continue pain control -PT/OT recommended SNF, however patient wanted to go home with home health/[facility] -explained to patient that she needs 24 hour supervision for at least one week - she stated her daughters would help her and "figure it out" -Son wants patient to go to SNF -social work consulted   Normocytic Anemia/ Anemia secondary to acute blood loss -s/p recent surgery -hemoglobin currently 9.4 (2 point drop after surgery) -Upon review of care everywhere, patient's hemoglobin appears to range between 11 and 12 -Denies melena, hematochezia -anemia panel and FOBT ordered -will continue to monitor CBC  closely as plavix and aspirin restarted by ortho today  Left lower extremity wound -patient follows with the wound clinic -wound care consulted, continuing outpatient wound care: cover with silver hydrofiber, secured with Kerlix and ask nursing to apply the compression sock/stockinete.  Essential hypertension -Continue amlodipine  History of CVA -Plavix currently held for surgery- will restart when anemia has stabilized  -Continue statin  Diabetes mellitus, type II -Continue insulin sliding scale CBG monitoring -Hemoglobin A1c 7  Right foot pain -Patient states that her right foot started hurting yesterday evening. -Obtained Xray of the right foot, no acute findings.  Marked osteopenia.  Probable subacute or chronic/healed fracture at the base of the fifth metatarsal -Continue supportive care and pain control  DVT Prophylaxis  SCDs  Code Status: DNR  Family Communication: None at bedside  Disposition Plan: Admitted. Pending SNF. Continue to monitor CBC/anemia work up.  Consultants Orthopedic surgery  Procedures  ORIF R intertrochanteric hip fracture with intramedullary nail and hip screw contstruct  Antibiotics   Anti-infectives (From admission, onward)   Start     Dose/Rate Route Frequency Ordered Stop   03/21/18 1800  clindamycin (CLEOCIN) IVPB 600 mg     600 mg 100 mL/hr over 30 Minutes Intravenous Every 6 hours 03/21/18 1405 03/22/18 0024   03/21/18 1052  clindamycin (CLEOCIN) 900 MG/50ML IVPB    Note to Pharmacy:  Mirian Moarver, [place]   : cabinet override      03/21/18 1052 03/21/18 1125      Subjective:   [name] seen and examined today.  Patient feels her pain is manageable, but feels she is weak. Denies  current chest pain, shortness of breath, abdominal pain, N/V/D/C, dizziness, headache.  Objective:   Vitals:   03/22/18 1050 03/22/18 1329 03/22/18 2218 03/23/18 0555  BP: (!) 120/49 (!) 106/49 (!) 108/46 (!) 126/55  Pulse: (!) 52 (!) 57 64 62    Resp: 18 16 15 16   Temp: (!) 97.5 F (36.4 C) 97.7 F (36.5 C) 99 F (37.2 C) 98.6 F (37 C)  TempSrc: Oral Oral Oral Oral  SpO2: 97% 97% 90% 97%  Weight:      Height:        Intake/Output Summary (Last 24 hours) at 03/23/2018 1016 Last data filed at 03/23/2018 0849 Gross per 24 hour  Intake 1080 ml  Output 2350 ml  Net -1270 ml   Filed Weights   03/22/18 0420  Weight: 89.5 kg (197 lb 5 oz)   Exam  General: Well developed, well nourished, elderly, NAD, appears stated age  HEENT: NCAT, mucous membranes moist.   Neck: Supple  Cardiovascular: S1 S2 auscultated, 2/6 SEM, RRR  Respiratory: Clear to auscultation bilaterally with equal chest rise  Abdomen: Soft, nontender, nondistended, + bowel sounds  Extremities: warm dry without cyanosis clubbing or edema. Dressing in place on LLE  Neuro: AAOx3, nonfocal  Psych: Normal affect and demeanor with intact judgement and insight  Data Reviewed: I have personally reviewed following labs and imaging studies  CBC: Recent Labs  Lab 03/20/18 2256 03/21/18 0401 03/22/18 0633 03/23/18 0546  WBC 9.0 8.2 8.1 6.9  HGB 11.7* 11.4* 10.5* 9.4*  HCT 37.6 36.3 33.7* 30.2*  MCV 86.2 85.2 86.6 86.3  PLT 191 168 160 156   Basic Metabolic Panel: Recent Labs  Lab 03/20/18 2256 03/21/18 0401 03/22/18 0633 03/23/18 0546  NA 145 144 135 140  K 3.8 3.5 3.7 3.8  CL 109 107 102 104  CO2 27 26 24 27   GLUCOSE 141* 187* 143* 156*  BUN 41* 37* 34* 32*  CREATININE 1.11* 0.88 0.97 0.93  CALCIUM 9.2 8.9 8.2* 8.3*   GFR: Estimated Creatinine Clearance: 55.7 mL/min (by C-G formula based on SCr of 0.93 mg/dL). Liver Function Tests: Recent Labs  Lab 03/21/18 0401  AST 17  ALT 18  ALKPHOS 55  BILITOT 0.7  PROT 6.1*  ALBUMIN 3.5   No results for input(s): LIPASE, AMYLASE in the last 168 hours. No results for input(s): AMMONIA in the last 168 hours. Coagulation Profile: Recent Labs  Lab 03/21/18 0401  INR 1.00   Cardiac  Enzymes: No results for input(s): CKTOTAL, CKMB, CKMBINDEX, TROPONINI in the last 168 hours. BNP (last 3 results) No results for input(s): PROBNP in the last 8760 hours. HbA1C: Recent Labs    03/21/18 0401  HGBA1C 7.0*   CBG: Recent Labs  Lab 03/22/18 0719 03/22/18 1138 03/22/18 1730 03/22/18 2220 03/23/18 0726  GLUCAP 150* 159* 209* 183* 145*   Lipid Profile: No results for input(s): CHOL, HDL, LDLCALC, TRIG, CHOLHDL, LDLDIRECT in the last 72 hours. Thyroid Function Tests: No results for input(s): TSH, T4TOTAL, FREET4, T3FREE, THYROIDAB in the last 72 hours. Anemia Panel: Recent Labs    03/23/18 0546 03/23/18 0801  VITAMINB12  --  772  FOLATE  --  14.9  FERRITIN  --  49  TIBC  --  263  IRON  --  18*  RETICCTPCT 1.3  --    Urine analysis: No results found for: COLORURINE, APPEARANCEUR, LABSPEC, PHURINE, GLUCOSEU, HGBUR, BILIRUBINUR, KETONESUR, PROTEINUR, UROBILINOGEN, NITRITE, LEUKOCYTESUR Sepsis Labs: @LABRCNTIP (procalcitonin:4,lacticidven:4)  )No results found for  this or any previous visit (from the past 240 hour(s)).    Radiology Studies: Dg C-arm 1-60 Min-no Report  Result Date: 03/21/2018 Fluoroscopy was utilized by the requesting physician.  No radiographic interpretation.   Dg Hip Operative Unilat W Or W/o Pelvis Left  Result Date: 03/21/2018 CLINICAL DATA:  Intraoperative imaging for fixation of a left intertrochanteric fracture the patient suffered in a fall 03/20/2018. Initial encounter. EXAM: OPERATIVE LEFT HIP (WITH PELVIS IF PERFORMED) 6 VIEWS TECHNIQUE: Fluoroscopic spot image(s) were submitted for interpretation post-operatively. COMPARISON:  Plain films left hip 03/20/2018. FINDINGS: Six fluoroscopic spot views demonstrate placement of a hip screw and long intramedullary nail for fixation of an intertrochanteric fracture. No acute abnormality. IMPRESSION: Intraoperative imaging for fixation of a left intertrochanteric fracture. Electronically Signed    By: Drusilla Kanner M.D.   On: 03/21/2018 12:22     Scheduled Meds: . amLODipine  10 mg Oral Daily  . aspirin EC  81 mg Oral Daily  . atorvastatin  40 mg Oral q1800  . clopidogrel  75 mg Oral Daily  . docusate sodium  100 mg Oral BID  . gabapentin  300 mg Oral TID  . insulin aspart  0-9 Units Subcutaneous TID WC  .  morphine injection  4 mg Intravenous Once  . senna  1 tablet Oral BID   Continuous Infusions: . methocarbamol (ROBAXIN)  IV       LOS: 3 days   Time Spent in minutes   45 minutes  Estle Sabella D.O. on 03/23/2018 at 10:16 AM  Between 7am to 7pm - Pager - [phone]  After 7pm go to [email or web address] - password [email or web address]  And look for the night coverage person covering for me after hours  [facility] Office  [phone]

## 2018-03-23 NOTE — Progress Notes (Signed)
Plan for d/c to SNF, discharge planning per CSW. [phone] 

## 2018-03-23 NOTE — Plan of Care (Signed)
Reviewed plan of care with pt and family, specifically pain control measures, safety precautions, and importance of notifying staff with any questions or concerns. Pt attentive and verbalized understanding of all education.

## 2018-03-24 DIAGNOSIS — Z09 Encounter for follow-up examination after completed treatment for conditions other than malignant neoplasm: Secondary | ICD-10-CM

## 2018-03-24 LAB — BASIC METABOLIC PANEL
Anion gap: 8 (ref 5–15)
BUN: 36 mg/dL — AB (ref 6–20)
CALCIUM: 8.3 mg/dL — AB (ref 8.9–10.3)
CO2: 29 mmol/L (ref 22–32)
CREATININE: 0.99 mg/dL (ref 0.44–1.00)
Chloride: 103 mmol/L (ref 101–111)
GFR calc Af Amer: 59 mL/min — ABNORMAL LOW (ref >60)
GFR, EST NON AFRICAN AMERICAN: 51 mL/min — AB (ref >60)
Glucose, Bld: 189 mg/dL — ABNORMAL HIGH (ref 65–99)
POTASSIUM: 4.2 mmol/L (ref 3.5–5.1)
SODIUM: 140 mmol/L (ref 135–145)

## 2018-03-24 LAB — CBC
HCT: 31 % — ABNORMAL LOW (ref 36.0–46.0)
Hemoglobin: 9.7 g/dL — ABNORMAL LOW (ref 12.0–15.0)
MCH: 26.7 pg (ref 26.0–34.0)
MCHC: 31.3 g/dL (ref 30.0–36.0)
MCV: 85.4 fL (ref 78.0–100.0)
PLATELETS: 174 10*3/uL (ref 150–400)
RBC: 3.63 MIL/uL — AB (ref 3.87–5.11)
RDW: 14.5 % (ref 11.5–15.5)
WBC: 6.7 10*3/uL (ref 4.0–10.5)

## 2018-03-24 LAB — GLUCOSE, CAPILLARY
Glucose-Capillary: 155 mg/dL — ABNORMAL HIGH (ref 65–99)
Glucose-Capillary: 210 mg/dL — ABNORMAL HIGH (ref 65–99)
Glucose-Capillary: 277 mg/dL — ABNORMAL HIGH (ref 65–99)
Glucose-Capillary: 255 mg/dL — ABNORMAL HIGH (ref 65–99)

## 2018-03-24 NOTE — Progress Notes (Signed)
The [facility] [place]. The facility will have a bed available 6/6 for the patient.   CSW informed the patient since they have been discharged today the patient will have to choose another facility or take the patient home.The patient is unsafe to discharge home without support.  CSW provided daughter with SNF bed offers.   Patient daughter states they did not have time to look at facilities because they were under the impression after speaking with the the physician the patient was discharging in the morning. Daughter states the only facility they are agreeable to is [facility] and they do not have any beds today. Patient daughter is not agreeable to any other facility at this time.   -Facilities will not admit new residents during the evening.   CSW updated nursing staff. CSW will continue to assist with discharge planning.    Linda BarrackNicole Finbar Hartman, Linda MajorsLCSWA, MSW Clinical Social Worker  [phone] 03/24/2018  4:51 PM

## 2018-03-24 NOTE — Progress Notes (Signed)
Physical Therapy Treatment Patient Details Name: Linda PalmsDarsena Hartman MRN: [id number] DOB: 04/09/1931 Today's Date: 03/24/2018    History of Present Illness 84 yo female admitted with L hip fx after sustaining a fall. S/P ORIF L hip 03/21/18. Hx of CVA with R residual weakness, DM, polyneuropathy, chronic back pain    PT Comments    POD # 3 pm session Assisted out of recliner to take a few steps to bed.  Max c/o weakness and fatigue.  Great difficulty weight shifting to advance either LE.  Pt will need ST Rehab at SNF prior to returning home.   Follow Up Recommendations        Equipment Recommendations       Recommendations for Other Services       Precautions / Restrictions Precautions Precautions: Fall Precaution Comments: old CVA R hemi amb prior to fall Restrictions Weight Bearing Restrictions: No Other Position/Activity Restrictions: WBAT    Mobility  Bed Mobility    Max Assist + 2 sit to supine and [facility]              Transfers      Max assist + 2 sit to stand and stand to sit              Ambulation/Gait      3 feet from recliner to bed with increased time and great difficulty weight shifting enough to advance either LE.  Pt would shuffle/scoot feet to progress.             Stairs             Wheelchair Mobility    Modified Rankin (Stroke Patients Only)        Time: 9562-13081504-1518 PT Time Calculation (min) (ACUTE ONLY): 14 min  Charges:  $Therapeutic Activity: 8-22 mins                    G Codes:       Felecia ShellingLori Kineta Fudala  PTA [facility]  Acute  Rehab Pager      [phone]

## 2018-03-24 NOTE — Progress Notes (Signed)
Physical Therapy Treatment Patient Details Name: [name] MRN: [id number] DOB: Jun 04, 1931 Today's Date: 03/24/2018    History of Present Illness 84 yo female admitted with L hip fx after sustaining a fall. S/P ORIF L hip 03/21/18. Hx of CVA with R residual weakness, DM, polyneuropathy, chronic back pain    PT Comments    POD # 3 am session Assisted to EOB required increased time and + 2 assist.  General transfer comment: + 2 side by side assist to rise with 50% VC's on proper hand placement.  Assisted from bed to [facility] then from [facility] to attempt amb  General Gait Details: pt required increased support B UE's and assist to advance L LE.  Great difficulty weight shifting towards to L side and great difficulty adnacing either LR.   Weak R LE s/p CVA  Follow Up Recommendations  SNF     Equipment Recommendations  Rolling walker with 5" wheels    Recommendations for Other Services       Precautions / Restrictions Precautions Precautions: Fall Precaution Comments: old CVA R hemi amb prior to fall Restrictions Weight Bearing Restrictions: No Other Position/Activity Restrictions: WBAT    Mobility  Bed Mobility Overal bed mobility: Needs Assistance Bed Mobility: Supine to Sit     Supine to sit: Max assist;+2 for physical assistance;+2 for safety/equipment     General bed mobility comments: increased assist for upper body, L LE and to complete scooting to EOB  Transfers Overall transfer level: Needs assistance Equipment used: Rolling walker (2 wheeled) Transfers: Sit to/from [employer] Sit to Stand: Max assist;+2 physical assistance;+2 safety/equipment;Total assist Stand pivot transfers: Max assist;Total assist;+2 physical assistance;+2 safety/equipment       General transfer comment: + 2 side by side assist to rise with 50% VC's on proper hand placement.  Assisted from bed to [facility] then from [facility] to attempt amb  Ambulation/Gait Ambulation/Gait assistance: Max  assist;Total assist;+2 physical assistance;+2 safety/equipment Ambulation Distance (Feet): 2 Feet Assistive device: Rolling walker (2 wheeled) Gait Pattern/deviations: Step-to pattern Gait velocity: decreased   General Gait Details: pt required increased support B UE's and assist to advance L LE.  Great difficulty weight shifting towards to L side and great difficulty adnacing either LR.   Weak R LE s/p CVA   Stairs             Wheelchair Mobility    Modified Rankin (Stroke Patients Only)       Balance                                            Cognition Arousal/Alertness: Awake/alert Behavior During Therapy: WFL for tasks assessed/performed Overall Cognitive Status: Within Functional Limits for tasks assessed                                        Exercises      General Comments        Pertinent Vitals/Pain Pain Assessment: Faces Faces Pain Scale: Hurts even more Pain Location: L hip with activity Pain Descriptors / Indicators: Sharp;Sore;Aching;Grimacing Pain Intervention(s): Monitored during session;Repositioned;Ice applied    Home Living                      Prior Function  PT Goals (current goals can now be found in the care plan section) Progress towards PT goals: Progressing toward goals    Frequency    Min 5X/week      PT Plan Current plan remains appropriate    Co-evaluation              AM-PAC PT "6 Clicks" Daily Activity  Outcome Measure  Difficulty turning over in bed (including adjusting bedclothes, sheets and blankets)?: A Lot Difficulty moving from lying on back to sitting on the side of the bed? : A Lot Difficulty sitting down on and standing up from a chair with arms (e.g., wheelchair, bedside commode, etc,.)?: A Lot Help needed moving to and from a bed to chair (including a wheelchair)?: A Lot Help needed walking in hospital room?: A Lot Help needed climbing 3-5  steps with a railing? : Total 6 Click Score: 11    End of Session Equipment Utilized During Treatment: Gait belt Activity Tolerance: Patient limited by fatigue;Patient limited by pain Patient left: in chair;with chair alarm set;with family/visitor present;with call bell/phone within reach   PT Visit Diagnosis: Difficulty in walking, not elsewhere classified (R26.2);Muscle weakness (generalized) (M62.81);Pain Pain - Right/Left: Left Pain - part of body: Hip     Time: [id number] PT Time Calculation (min) (ACUTE ONLY): 24 min  Charges:  $Gait Training: 8-22 mins $Therapeutic Activity: 8-22 mins                    G Codes:       Felecia ShellingLori Aithan Farrelly  PTA [facility]  Acute  Rehab Pager      [phone]

## 2018-03-24 NOTE — Anesthesia Postprocedure Evaluation (Signed)
Anesthesia Post Note  Patient: Linda PalmsDarsena Hartman  Procedure(s) Performed: INTRAMEDULLARY (IM) NAIL INTERTROCHANTERIC (Left Hip)     Patient location during evaluation: PACU Anesthesia Type: General Level of consciousness: awake and alert Pain management: pain level controlled Vital Signs Assessment: post-procedure vital signs reviewed and stable Respiratory status: spontaneous breathing, nonlabored ventilation, respiratory function stable and patient connected to nasal cannula oxygen Cardiovascular status: blood pressure returned to baseline and stable Postop Assessment: no apparent nausea or vomiting Anesthetic complications: no    Last Vitals:  Vitals:   03/23/18 2143 03/24/18 0522  BP: 110/75 (!) 117/55  Pulse: 65 64  Resp: 14   Temp: 36.9 C 36.5 C  SpO2: 94% 96%    Last Pain:  Vitals:   03/24/18 0745  TempSrc:   PainSc: 4                  Caidon Foti S

## 2018-03-24 NOTE — Discharge Summary (Signed)
Physician Discharge Summary  Linda PalmsDarsena Sipp UJW:[id number] DOB: 11/30/1930 DOA: 03/20/2018  PCP: Patient, No Pcp Per  Admit date: 03/20/2018 Discharge date: 03/24/2018  Time spent: 36 minutes  Recommendations for Outpatient Follow-up:  1. Ensure patient f/u with ortho   Discharge Diagnoses:  Active Problems:   Essential hypertension   Closed left hip fracture (HCC)   DM type 2 with diabetic peripheral neuropathy (HCC)   Right hemiplegia (HCC)   History of CVA with residual deficit   Hip fracture (HCC)   Closed nondisplaced intertrochanteric fracture of left femur (HCC)   Fall from slip, trip, or stumble, initial encounter   Pain   Anemia   Wound of left leg   Discharge Condition: stable  Diet recommendation: Carb modified diet.   Filed Weights   03/22/18 0420  Weight: 89.5 kg (197 lb 5 oz)    History of present illness:  84 y.o.femalewith medical history significant of DM2,polyneuropathy, HTN, HLD, chronic back pain, hx of CVA with residual right side weakness. Presented withFall after got walker stuck in revolving door.  Found to have left hip fracture, orthopedic surgery consulted.  Hospital Course:  Left hip fracture -Secondary to recent fall -Undisplaced intertrochanteric left femoral fracture -Orthopedics consulted and appreciated -s/p ORIF R intertrochanteric hip fracture with intramedullary nail and hip screw contstruct -Patient can WBAT -Plan is to discharge to SNF for continued physical therapy  Normocytic Anemia/ Anemia secondary to acute blood loss -s/p recent surgery -hemoglobin currently 9.4 (2 point drop after surgery) -Upon review of care everywhere, patient's hemoglobin appears to range between 11 and 12 -Denies melena, hematochezia -anemia panel and FOBT ordered -will continue to monitor CBC closely as plavix and aspirin restarted by ortho today  Left lower extremity wound -patient follows with the wound clinic -wound care consulted,  continuing outpatient wound care: cover with silver hydrofiber, secured with Kerlix and ask nursing to apply the compression sock/stockinete.  Essential hypertension -Continue prior to admission medication regimen  History of CVA -Continue prior to admission medication regimen  Diabetes mellitus, type II -Continue prior to admission medication regimen  Right foot pain -Obtained Xray of the right foot, no acute findings.  Marked osteopenia.  Probable subacute or chronic/healed fracture at the base of the fifth metatarsal -Continue supportive care and pain control  Procedures:  As listed above  Consultations:  Orthopedic surgery: Dr. Magnus IvanBlackman  Discharge Exam: Vitals:   03/24/18 1210 03/24/18 1301  BP: (!) 116/48 (!) 150/47  Pulse: 69 71  Resp:  (!) 24  Temp:  98.8 F (37.1 C)  SpO2: 99% 96%    General: Patient no acute distress, awake and alert Cardiovascular: Regular rate and rhythm, no murmurs rubs Respiratory: No increased work of breathing, no wheezes  Discharge Instructions   Discharge Instructions    Call MD for:  severe uncontrolled pain   Complete by:  As directed    Call MD for:  temperature >100.4   Complete by:  As directed    Diet - low sodium heart healthy   Complete by:  As directed    Discharge instructions   Complete by:  As directed    Please ensure patient follows up with Orthopeadic surgeon after hospital discharge.   Increase activity slowly   Complete by:  As directed      Allergies as of 03/24/2018      Reactions   Penicillins Other (See Comments)   Has patient had a PCN reaction causing immediate rash, facial/tongue/throat swelling, SOB or  lightheadedness with hypotension: Unknown Has patient had a PCN reaction causing severe rash involving mucus membranes or skin necrosis: Unknown Has patient had a PCN reaction that required hospitalization: Unknown Has patient had a PCN reaction occurring within the last 10 years: No If all of  the above answers are "NO", then may proceed with Cephalosporin use.      Medication List    TAKE these medications   alendronate 70 MG tablet Commonly known as:  FOSAMAX Take 70 mg by mouth once a week. Take with a full glass of water on an empty stomach on Wednesday   amLODipine 10 MG tablet Commonly known as:  NORVASC Take 10 mg by mouth daily.   aspirin EC 81 MG tablet Take 81 mg by mouth daily.   atorvastatin 40 MG tablet Commonly known as:  LIPITOR Take 40 mg by mouth daily.   benazepril 20 MG tablet Commonly known as:  LOTENSIN Take 20 mg by mouth daily.   chlorthalidone 25 MG tablet Commonly known as:  HYGROTON Take 25 mg by mouth daily.   cholecalciferol 1000 units tablet Commonly known as:  VITAMIN D Take 1,000 Units by mouth daily.   clopidogrel 75 MG tablet Commonly known as:  PLAVIX Take 75 mg by mouth daily.   gabapentin 300 MG capsule Commonly known as:  NEURONTIN Take 300 mg by mouth 3 (three) times daily.   glipiZIDE 10 MG tablet Commonly known as:  GLUCOTROL Take 10 mg by mouth daily before breakfast.   HYDROcodone-acetaminophen 5-325 MG tablet Commonly known as:  NORCO/VICODIN Take 1-2 tablets by mouth every 4 (four) hours as needed for moderate pain. What changed:    how much to take  when to take this   JARDIANCE 10 MG Tabs tablet Generic drug:  empagliflozin Take 10 mg by mouth daily.   metFORMIN 1000 MG tablet Commonly known as:  GLUCOPHAGE Take 1,000 mg by mouth 2 (two) times daily.   multivitamin with minerals tablet Take 1 tablet by mouth daily.   terazosin 1 MG capsule Commonly known as:  HYTRIN Take 1 mg by mouth at bedtime.   Vitamin B12-Folic Acid 500-400 MCG Tabs Take 1 tablet by mouth daily.      Allergies  Allergen Reactions  . Penicillins Other (See Comments)    Has patient had a PCN reaction causing immediate rash, facial/tongue/throat swelling, SOB or lightheadedness with hypotension: Unknown Has patient  had a PCN reaction causing severe rash involving mucus membranes or skin necrosis: Unknown Has patient had a PCN reaction that required hospitalization: Unknown Has patient had a PCN reaction occurring within the last 10 years: No If all of the above answers are "NO", then may proceed with Cephalosporin use.   Follow-up Information    Kathryne Hitch, MD. Schedule an appointment as soon as possible for a visit in 2 week(s).   Specialty:  Orthopedic Surgery Contact information: [address] [place] [address] [phone]            The results of significant diagnostics from this hospitalization (including imaging, microbiology, ancillary and laboratory) are listed below for reference.    Significant Diagnostic Studies: Dg Chest 1 View  Result Date: 03/20/2018 CLINICAL DATA:  Preoperative chest radiograph prior to LEFT hip surgery. EXAM: CHEST  1 VIEW COMPARISON:  None. FINDINGS: The patient is slightly rotated. UPPER limits normal heart size noted. There is no evidence of focal airspace disease, pulmonary edema, suspicious pulmonary nodule/mass, pleural effusion, or pneumothorax. No  acute bony abnormalities are identified. IMPRESSION: No active disease. Electronically Signed   By: Harmon Pier M.D.   On: 03/20/2018 22:41   Dg Foot Complete Right  Result Date: 03/21/2018 CLINICAL DATA:  Larey Seat yesterday, RIGHT foot pain. EXAM: RIGHT FOOT COMPLETE - 3+ VIEW COMPARISON:  None. FINDINGS: Diffuse osteopenia limits characterization of osseous detail, however, there is no acute appearing fracture line or displaced fracture fragment. Sclerotic changes at the base of the fifth metatarsal bone suggests subacute or chronic/healed fracture. Adjacent soft tissues are unremarkable. IMPRESSION: 1. No acute findings, as detailed above. 2. Marked osteopenia, limiting characterization. 3. Probable subacute or chronic/healed fracture at the base of the fifth metatarsal bone.  Electronically Signed   By: Bary Richard M.D.   On: 03/21/2018 09:11   Dg C-arm 1-60 Min-no Report  Result Date: 03/21/2018 Fluoroscopy was utilized by the requesting physician.  No radiographic interpretation.   Dg Hip Operative Unilat W Or W/o Pelvis Left  Result Date: 03/21/2018 CLINICAL DATA:  Intraoperative imaging for fixation of a left intertrochanteric fracture the patient suffered in a fall 03/20/2018. Initial encounter. EXAM: OPERATIVE LEFT HIP (WITH PELVIS IF PERFORMED) 6 VIEWS TECHNIQUE: Fluoroscopic spot image(s) were submitted for interpretation post-operatively. COMPARISON:  Plain films left hip 03/20/2018. FINDINGS: Six fluoroscopic spot views demonstrate placement of a hip screw and long intramedullary nail for fixation of an intertrochanteric fracture. No acute abnormality. IMPRESSION: Intraoperative imaging for fixation of a left intertrochanteric fracture. Electronically Signed   By: Drusilla Kanner M.D.   On: 03/21/2018 12:22   Dg Hip Unilat W Or W/o Pelvis 2-3 Views Left  Result Date: 03/20/2018 CLINICAL DATA:  Recent fall left hip pain, initial encounter EXAM: DG HIP (WITH OR WITHOUT PELVIS) 2-3V LEFT COMPARISON:  None. FINDINGS: There is a proximal left femoral fracture involving the intratrochanteric region without significant displacement. Pelvic ring is intact. No other focal abnormality is seen. IMPRESSION: Undisplaced intratrochanteric left femoral fracture. Electronically Signed   By: Alcide Clever M.D.   On: 03/20/2018 21:44    Microbiology: No results found for this or any previous visit (from the past 240 hour(s)).   Labs: Basic Metabolic Panel: Recent Labs  Lab 03/20/18 2256 03/21/18 0401 03/22/18 0633 03/23/18 0546 03/24/18 0537  NA 145 144 135 140 140  K 3.8 3.5 3.7 3.8 4.2  CL 109 107 102 104 103  CO2 27 26 24 27 29   GLUCOSE 141* 187* 143* 156* 189*  BUN 41* 37* 34* 32* 36*  CREATININE 1.11* 0.88 0.97 0.93 0.99  CALCIUM 9.2 8.9 8.2* 8.3* 8.3*    Liver Function Tests: Recent Labs  Lab 03/21/18 0401  AST 17  ALT 18  ALKPHOS 55  BILITOT 0.7  PROT 6.1*  ALBUMIN 3.5   No results for input(s): LIPASE, AMYLASE in the last 168 hours. No results for input(s): AMMONIA in the last 168 hours. CBC: Recent Labs  Lab 03/20/18 2256 03/21/18 0401 03/22/18 0633 03/23/18 0546 03/24/18 0537  WBC 9.0 8.2 8.1 6.9 6.7  HGB 11.7* 11.4* 10.5* 9.4* 9.7*  HCT 37.6 36.3 33.7* 30.2* 31.0*  MCV 86.2 85.2 86.6 86.3 85.4  PLT 191 168 160 156 174   Cardiac Enzymes: No results for input(s): CKTOTAL, CKMB, CKMBINDEX, TROPONINI in the last 168 hours. BNP: BNP (last 3 results) No results for input(s): BNP in the last 8760 hours.  ProBNP (last 3 results) No results for input(s): PROBNP in the last 8760 hours.  CBG: Recent Labs  Lab 03/23/18 1125  03/23/18 1721 03/23/18 2140 03/24/18 0743 03/24/18 1154  GLUCAP 194* 176* 212* 155* 210*    Signed:  Penny Pia MD.  [facility] 03/24/2018, 3:15 PM

## 2018-03-25 LAB — GLUCOSE, CAPILLARY: GLUCOSE-CAPILLARY: 192 mg/dL — AB (ref 65–99)

## 2018-03-25 NOTE — Progress Notes (Signed)
Called report to Alexia [facility],RN. Reports having no further questions at this time. Patient transferred by [facility] and aware patient will arrive in an hour and a half.

## 2018-03-25 NOTE — Clinical Social Work Placement (Addendum)
D/C summary sent [phone] Nurse given number to call report.  [facility] arranged to transport.   CLINICAL SOCIAL WORK PLACEMENT  NOTE  Date:  03/25/2018  Patient Details  Name: [name] MRN: [id number] Date of Birth: 01/06/1931  Clinical Social Work is seeking post-discharge placement for this patient at the Skilled  Nursing Facility level of care (*CSW will initial, date and re-position this form in  chart as items are completed):  Yes   Patient/family provided with [facility] Clinical Social Work Department's list of facilities offering this level of care within the geographic area requested by the patient (or if unable, by the patient's family).  Yes   Patient/family informed of their freedom to choose among providers that offer the needed level of care, that participate in Medicare, Medicaid or managed care program needed by the patient, have an available bed and are willing to accept the patient.  Yes   Patient/family informed of [facility]'s ownership interest in [facility] and [facility], as well as of the fact that they are under no obligation to receive care at these facilities.  PASRR submitted to EDS on 03/23/18     PASRR number received on 03/23/18     Existing PASRR number confirmed on       FL2 transmitted to all facilities in geographic area requested by pt/family on       FL2 transmitted to all facilities within larger geographic area on       Patient informed that his/her managed care company has contracts with or will negotiate with certain facilities, including the following:        Yes   Patient/family informed of bed offers received.  [facility]   Physician recommends and patient chooses bed at      Patient to be transferred to   on  . 03/25/2018  Patient to be transferred to facility by     [facility] private pay   Patient family notified on   of transfer.  03/25/2018  Name of family member notified:       Daughter at bedside. Son Bruce by phone  PHYSICIAN       Additional Comment:    _______________________________________________ Clearance CootsNicole A Pearla Mckinny, LCSW 03/25/2018, 8:57 AM

## 2018-04-07 ENCOUNTER — Telehealth (INDEPENDENT_AMBULATORY_CARE_PROVIDER_SITE_OTHER): Payer: Self-pay

## 2018-04-07 NOTE — Telephone Encounter (Signed)
This ok? 

## 2018-04-07 NOTE — Telephone Encounter (Signed)
Verbal order given to remove staples

## 2018-04-07 NOTE — Telephone Encounter (Signed)
That will be fine. 

## 2018-04-07 NOTE — Telephone Encounter (Signed)
Message on triage vm. Pt had Surgery on 03/21/18. In [place] [facility]. PA there Wants verbal ok to remove pts staples. Said wound looks good.

## 2018-04-26 ENCOUNTER — Ambulatory Visit (INDEPENDENT_AMBULATORY_CARE_PROVIDER_SITE_OTHER): Payer: Medicare Other

## 2018-04-26 ENCOUNTER — Ambulatory Visit (INDEPENDENT_AMBULATORY_CARE_PROVIDER_SITE_OTHER): Payer: Medicare Other | Admitting: Orthopaedic Surgery

## 2018-04-26 ENCOUNTER — Encounter (INDEPENDENT_AMBULATORY_CARE_PROVIDER_SITE_OTHER): Payer: Self-pay | Admitting: Orthopaedic Surgery

## 2018-04-26 DIAGNOSIS — S72145D Nondisplaced intertrochanteric fracture of left femur, subsequent encounter for closed fracture with routine healing: Secondary | ICD-10-CM

## 2018-04-26 DIAGNOSIS — M25552 Pain in left hip: Secondary | ICD-10-CM

## 2018-04-26 NOTE — Progress Notes (Signed)
  The patient is 36 days status post open reduction and fixation of a left intertrochanteric hip fracture.  She is making progress according to family.  She does still report left hip pain.  On exam I can put her left hip through internal or external rotation and it feels better than it did she states before the fracture.  X-rays confirm no fracture appears almost healed.  This was a completely nondisplaced fracture preoperative.  At this point should continue increase her activities.  I would like to see her back for final visit in 6 weeks with a final AP and lateral of the left hip only.  I do not need to see the right hip or the pelvis.

## 2018-06-07 ENCOUNTER — Ambulatory Visit (INDEPENDENT_AMBULATORY_CARE_PROVIDER_SITE_OTHER): Payer: Medicare Other | Admitting: Orthopaedic Surgery

## 2018-06-17 ENCOUNTER — Encounter (INDEPENDENT_AMBULATORY_CARE_PROVIDER_SITE_OTHER): Payer: Self-pay | Admitting: Orthopaedic Surgery

## 2018-06-17 ENCOUNTER — Ambulatory Visit (INDEPENDENT_AMBULATORY_CARE_PROVIDER_SITE_OTHER): Payer: Medicare Other | Admitting: Orthopaedic Surgery

## 2018-06-17 ENCOUNTER — Ambulatory Visit (INDEPENDENT_AMBULATORY_CARE_PROVIDER_SITE_OTHER): Payer: Medicare Other

## 2018-06-17 DIAGNOSIS — S72145D Nondisplaced intertrochanteric fracture of left femur, subsequent encounter for closed fracture with routine healing: Secondary | ICD-10-CM

## 2018-06-17 NOTE — Progress Notes (Signed)
Patient is very pleasant and active 84 year old female who is 88 days status post open reduction and fixation of a nondisplaced left hip intertrochanteric fracture.  She ablates with a rolling walker and says she is doing well overall and has minimal pain discomfort.  On exam her incisions healed nicely.  I can easily put her left hip through internal extra rotation with no difficulty in all and minimal discomfort.  X-rays also confirm the fracture is healed and there is no evidence of hardware failure.  This point she will follow-up as needed.  She is going to continue to work on her balance coordination and hip strengthening.  All question concerns were answered and addressed.

## 2018-07-11 IMAGING — DX DG FOOT COMPLETE 3+V*R*
4 series · 4 of 4 positions shown · non-contrast
Comparison: None.

CLINICAL DATA: Fell yesterday, RIGHT foot pain.

EXAM:
RIGHT FOOT COMPLETE - 3+ VIEW

[foot ap]
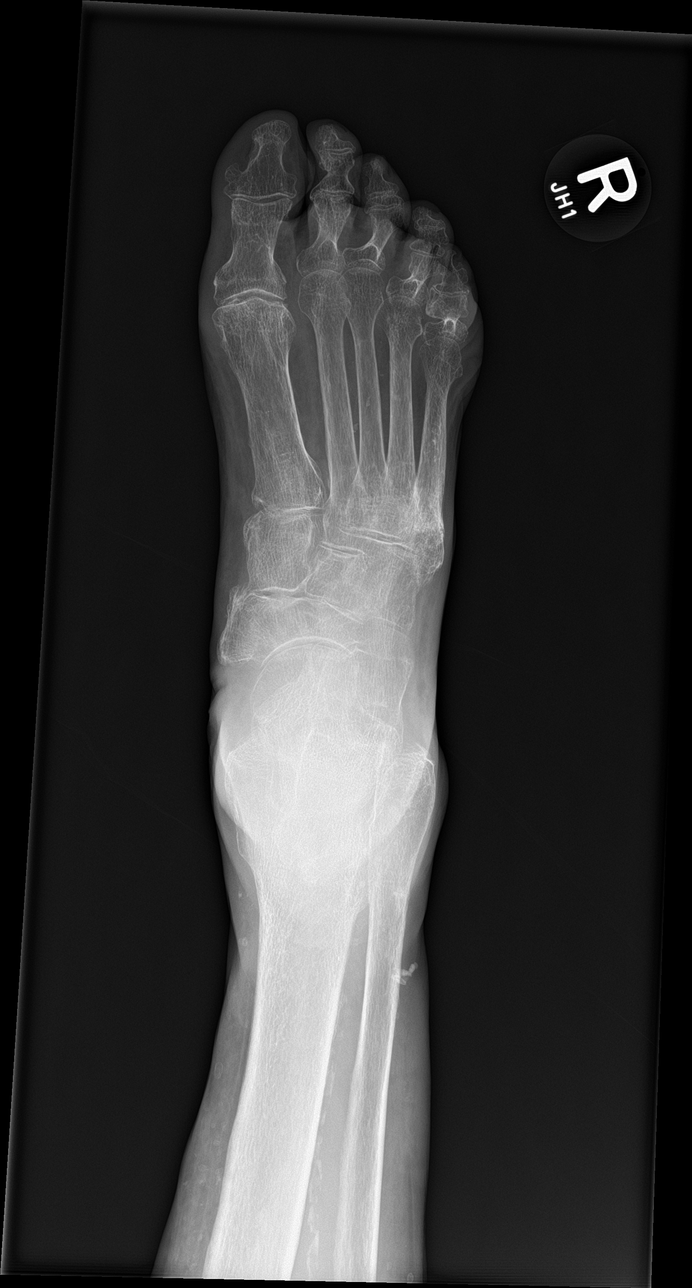

[foot obl]
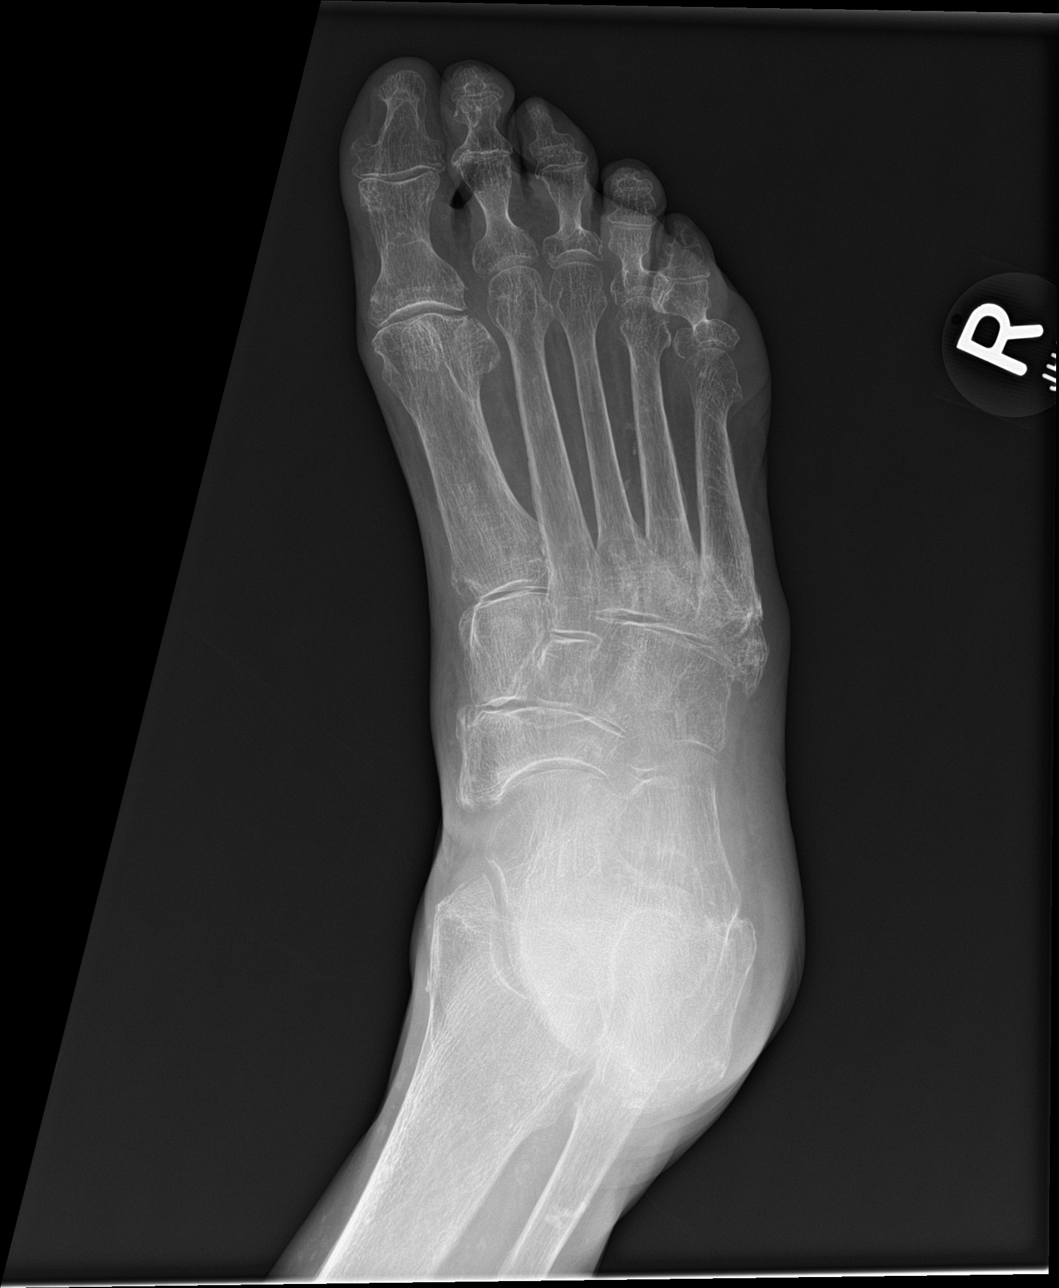

[foot lat (1 of 2)]
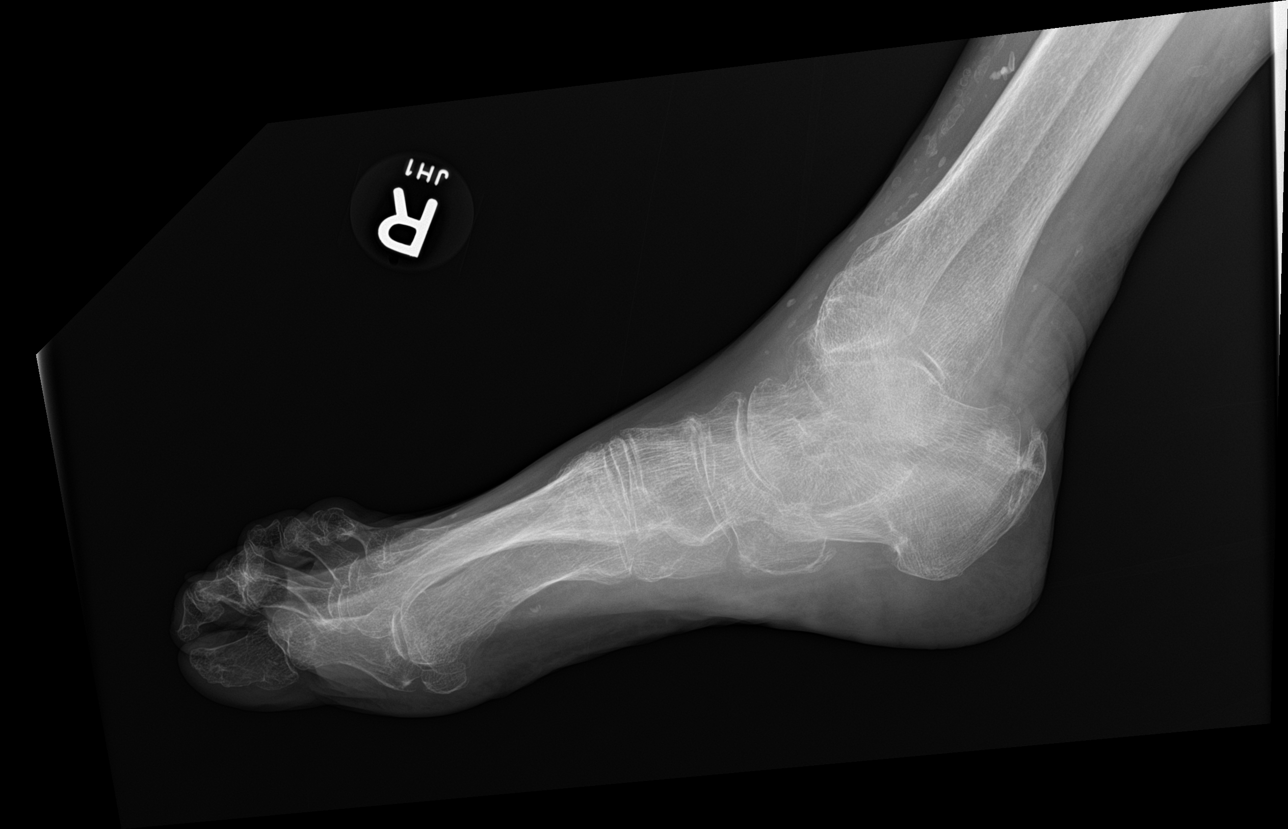

[foot lat (2 of 2)]
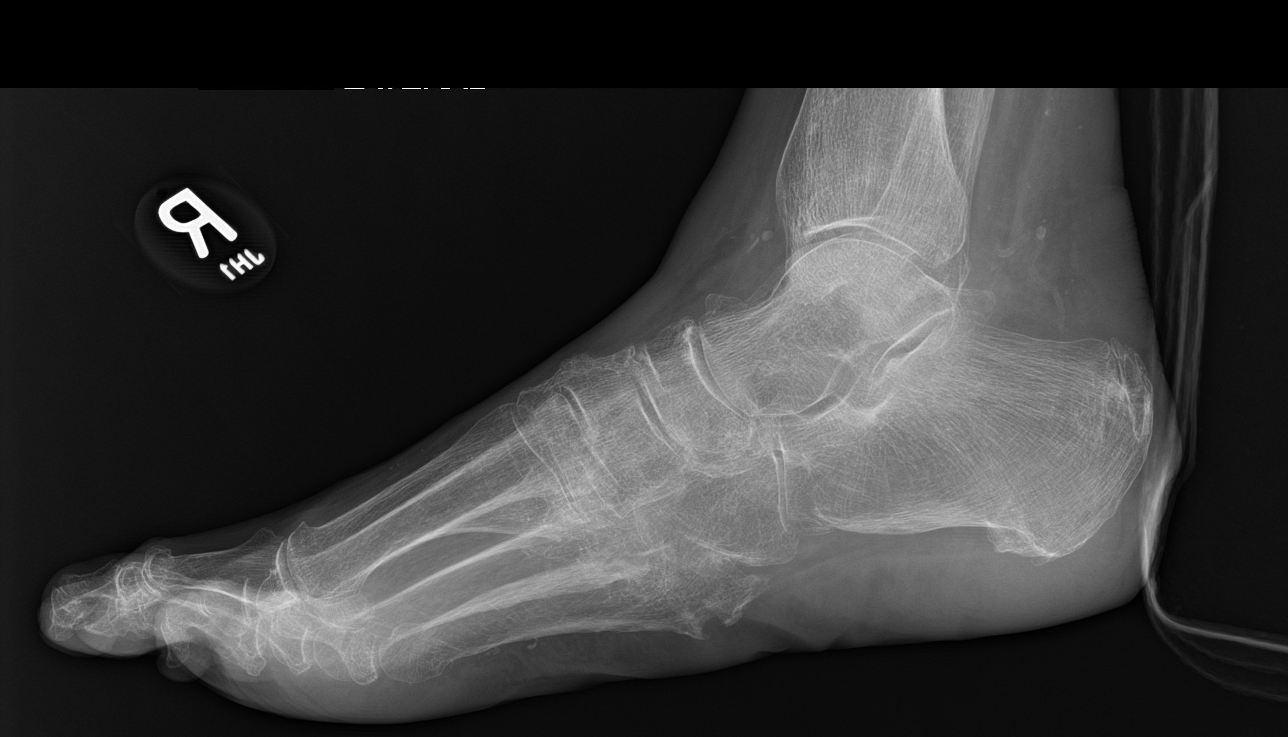

[4 of 4 positions shown; findings below may reference images not displayed]

FINDINGS: Diffuse osteopenia limits characterization of osseous detail,
however, there is no acute appearing fracture line or displaced
fracture fragment. Sclerotic changes at the base of the fifth
metatarsal bone suggests subacute or chronic/healed fracture.
Adjacent soft tissues are unremarkable.
IMPRESSION: 1. No acute findings, as detailed above.
2. Marked osteopenia, limiting characterization.
3. Probable subacute or chronic/healed fracture at the base of the
fifth metatarsal bone.

## 2021-11-20 DEATH — deceased
